# Patient Record
Sex: Female | Born: 1950 | Race: White | Hispanic: No | Smoking: Never smoker
Health system: Southern US, Community
[De-identification: ages and names within clinical notes are randomized; demographics above are authoritative.]

## PROBLEM LIST (undated history)

## (undated) DIAGNOSIS — E785 Hyperlipidemia, unspecified: Secondary | ICD-10-CM

## (undated) DIAGNOSIS — I6529 Occlusion and stenosis of unspecified carotid artery: Secondary | ICD-10-CM

## (undated) HISTORY — PX: TONSILLECTOMY: SUR1361

## (undated) HISTORY — PX: OTHER SURGICAL HISTORY: SHX169

## (undated) HISTORY — PX: CHOLECYSTECTOMY: SHX55

---

## 2000-10-05 ENCOUNTER — Encounter: Payer: Self-pay | Admitting: Gastroenterology

## 2000-10-05 ENCOUNTER — Ambulatory Visit (HOSPITAL_COMMUNITY): Admission: RE | Admit: 2000-10-05 | Discharge: 2000-10-05 | Payer: Self-pay | Admitting: Gastroenterology

## 2004-12-16 ENCOUNTER — Ambulatory Visit: Payer: Self-pay | Admitting: Obstetrics and Gynecology

## 2005-09-14 ENCOUNTER — Ambulatory Visit: Payer: Self-pay

## 2005-09-19 ENCOUNTER — Ambulatory Visit: Payer: Self-pay

## 2006-11-01 ENCOUNTER — Ambulatory Visit: Payer: Self-pay | Admitting: Obstetrics and Gynecology

## 2008-07-22 ENCOUNTER — Ambulatory Visit: Payer: Self-pay | Admitting: Obstetrics and Gynecology

## 2012-11-27 ENCOUNTER — Ambulatory Visit: Payer: Self-pay

## 2014-02-10 ENCOUNTER — Ambulatory Visit: Payer: Self-pay

## 2015-04-21 ENCOUNTER — Other Ambulatory Visit: Payer: Self-pay | Admitting: Internal Medicine

## 2015-04-21 DIAGNOSIS — Z1231 Encounter for screening mammogram for malignant neoplasm of breast: Secondary | ICD-10-CM

## 2015-05-05 ENCOUNTER — Ambulatory Visit: Payer: Self-pay

## 2015-05-19 ENCOUNTER — Ambulatory Visit
Admission: RE | Admit: 2015-05-19 | Discharge: 2015-05-19 | Disposition: A | Discharge status: Home / Self Care | Payer: PPO | Source: Ambulatory Visit | Attending: Internal Medicine | Admitting: Internal Medicine

## 2015-05-19 DIAGNOSIS — Z1231 Encounter for screening mammogram for malignant neoplasm of breast: Secondary | ICD-10-CM | POA: Diagnosis present

## 2016-01-12 DIAGNOSIS — H353131 Nonexudative age-related macular degeneration, bilateral, early dry stage: Secondary | ICD-10-CM | POA: Diagnosis not present

## 2016-04-20 DIAGNOSIS — E785 Hyperlipidemia, unspecified: Secondary | ICD-10-CM | POA: Diagnosis not present

## 2016-04-26 DIAGNOSIS — R079 Chest pain, unspecified: Secondary | ICD-10-CM | POA: Diagnosis not present

## 2016-04-26 DIAGNOSIS — Z0000 Encounter for general adult medical examination without abnormal findings: Secondary | ICD-10-CM | POA: Diagnosis not present

## 2016-05-03 DIAGNOSIS — R0789 Other chest pain: Secondary | ICD-10-CM | POA: Diagnosis not present

## 2016-05-03 DIAGNOSIS — E782 Mixed hyperlipidemia: Secondary | ICD-10-CM | POA: Diagnosis not present

## 2016-05-03 DIAGNOSIS — D485 Neoplasm of uncertain behavior of skin: Secondary | ICD-10-CM | POA: Diagnosis not present

## 2016-05-03 DIAGNOSIS — I208 Other forms of angina pectoris: Secondary | ICD-10-CM | POA: Diagnosis not present

## 2016-05-03 DIAGNOSIS — I6523 Occlusion and stenosis of bilateral carotid arteries: Secondary | ICD-10-CM | POA: Diagnosis not present

## 2016-05-03 DIAGNOSIS — B078 Other viral warts: Secondary | ICD-10-CM | POA: Diagnosis not present

## 2016-05-03 DIAGNOSIS — L820 Inflamed seborrheic keratosis: Secondary | ICD-10-CM | POA: Diagnosis not present

## 2016-05-05 DIAGNOSIS — I208 Other forms of angina pectoris: Secondary | ICD-10-CM | POA: Diagnosis not present

## 2016-05-12 DIAGNOSIS — I208 Other forms of angina pectoris: Secondary | ICD-10-CM | POA: Diagnosis not present

## 2016-05-23 DIAGNOSIS — E782 Mixed hyperlipidemia: Secondary | ICD-10-CM | POA: Diagnosis not present

## 2016-05-23 DIAGNOSIS — R0782 Intercostal pain: Secondary | ICD-10-CM | POA: Diagnosis not present

## 2016-05-23 DIAGNOSIS — I6523 Occlusion and stenosis of bilateral carotid arteries: Secondary | ICD-10-CM | POA: Diagnosis not present

## 2016-05-23 DIAGNOSIS — I208 Other forms of angina pectoris: Secondary | ICD-10-CM | POA: Diagnosis not present

## 2016-08-23 DIAGNOSIS — Z1211 Encounter for screening for malignant neoplasm of colon: Secondary | ICD-10-CM | POA: Diagnosis not present

## 2016-08-30 DIAGNOSIS — Z1212 Encounter for screening for malignant neoplasm of rectum: Secondary | ICD-10-CM | POA: Diagnosis not present

## 2016-08-30 DIAGNOSIS — Z1211 Encounter for screening for malignant neoplasm of colon: Secondary | ICD-10-CM | POA: Diagnosis not present

## 2016-08-31 ENCOUNTER — Other Ambulatory Visit: Payer: Self-pay | Admitting: Internal Medicine

## 2016-08-31 DIAGNOSIS — Z1231 Encounter for screening mammogram for malignant neoplasm of breast: Secondary | ICD-10-CM

## 2016-09-26 ENCOUNTER — Ambulatory Visit
Admission: RE | Admit: 2016-09-26 | Discharge: 2016-09-26 | Disposition: A | Discharge status: Home / Self Care | Payer: PPO | Source: Ambulatory Visit | Attending: Internal Medicine | Admitting: Internal Medicine

## 2016-09-26 DIAGNOSIS — Z1231 Encounter for screening mammogram for malignant neoplasm of breast: Secondary | ICD-10-CM | POA: Diagnosis not present

## 2016-12-23 ENCOUNTER — Encounter: Payer: Self-pay | Admitting: *Deleted

## 2016-12-26 ENCOUNTER — Encounter
Admission: RE | Disposition: A | Discharge status: Home / Self Care | Payer: Self-pay | Source: Ambulatory Visit | Attending: Unknown Physician Specialty

## 2016-12-26 ENCOUNTER — Ambulatory Visit: Payer: PPO | Admitting: Anesthesiology

## 2016-12-26 ENCOUNTER — Ambulatory Visit
Admission: RE | Admit: 2016-12-26 | Discharge: 2016-12-26 | Disposition: A | Discharge status: Home / Self Care | Payer: PPO | Source: Ambulatory Visit | Attending: Unknown Physician Specialty | Admitting: Unknown Physician Specialty

## 2016-12-26 ENCOUNTER — Encounter: Payer: Self-pay | Admitting: *Deleted

## 2016-12-26 DIAGNOSIS — K640 First degree hemorrhoids: Secondary | ICD-10-CM | POA: Insufficient documentation

## 2016-12-26 DIAGNOSIS — Z1211 Encounter for screening for malignant neoplasm of colon: Secondary | ICD-10-CM | POA: Diagnosis not present

## 2016-12-26 DIAGNOSIS — K219 Gastro-esophageal reflux disease without esophagitis: Secondary | ICD-10-CM | POA: Diagnosis not present

## 2016-12-26 DIAGNOSIS — Z79899 Other long term (current) drug therapy: Secondary | ICD-10-CM | POA: Diagnosis not present

## 2016-12-26 DIAGNOSIS — I739 Peripheral vascular disease, unspecified: Secondary | ICD-10-CM | POA: Diagnosis not present

## 2016-12-26 DIAGNOSIS — E785 Hyperlipidemia, unspecified: Secondary | ICD-10-CM | POA: Diagnosis not present

## 2016-12-26 DIAGNOSIS — K5730 Diverticulosis of large intestine without perforation or abscess without bleeding: Secondary | ICD-10-CM | POA: Diagnosis not present

## 2016-12-26 DIAGNOSIS — R195 Other fecal abnormalities: Secondary | ICD-10-CM | POA: Diagnosis not present

## 2016-12-26 DIAGNOSIS — K648 Other hemorrhoids: Secondary | ICD-10-CM | POA: Diagnosis not present

## 2016-12-26 DIAGNOSIS — K5790 Diverticulosis of intestine, part unspecified, without perforation or abscess without bleeding: Secondary | ICD-10-CM | POA: Diagnosis not present

## 2016-12-26 HISTORY — PX: COLONOSCOPY WITH PROPOFOL: SHX5780

## 2016-12-26 HISTORY — DX: Occlusion and stenosis of unspecified carotid artery: I65.29

## 2016-12-26 HISTORY — DX: Hyperlipidemia, unspecified: E78.5

## 2016-12-26 SURGERY — COLONOSCOPY WITH PROPOFOL
Anesthesia: General

## 2016-12-26 MED ORDER — MIDAZOLAM HCL 5 MG/5ML IJ SOLN
INTRAMUSCULAR | Status: DC | PRN
  Administered 2016-12-26 (×2): 1 mg via INTRAVENOUS

## 2016-12-26 MED ORDER — SODIUM CHLORIDE 0.9 % IV SOLN
INTRAVENOUS | Status: DC
  Administered 2016-12-26: 09:00:00 via INTRAVENOUS

## 2016-12-26 MED ORDER — PROPOFOL 10 MG/ML IV BOLUS
INTRAVENOUS | Status: DC | PRN
  Administered 2016-12-26: 40 mg via INTRAVENOUS

## 2016-12-26 MED ORDER — MIDAZOLAM HCL 2 MG/2ML IJ SOLN
INTRAMUSCULAR | Status: AC
  Filled 2016-12-26: qty 2

## 2016-12-26 MED ORDER — LIDOCAINE HCL (PF) 2 % IJ SOLN
INTRAMUSCULAR | Status: DC | PRN
  Administered 2016-12-26: 40 mg

## 2016-12-26 MED ORDER — LIDOCAINE 2% (20 MG/ML) 5 ML SYRINGE
INTRAMUSCULAR | Status: AC
  Filled 2016-12-26: qty 5

## 2016-12-26 MED ORDER — ONDANSETRON HCL 4 MG/2ML IJ SOLN
INTRAMUSCULAR | Status: AC
  Filled 2016-12-26: qty 2

## 2016-12-26 MED ORDER — SODIUM CHLORIDE 0.9 % IV SOLN: INTRAVENOUS | Status: DC

## 2016-12-26 MED ORDER — FENTANYL CITRATE (PF) 100 MCG/2ML IJ SOLN
INTRAMUSCULAR | Status: DC | PRN
  Administered 2016-12-26 (×2): 25 ug via INTRAVENOUS
  Administered 2016-12-26: 50 ug via INTRAVENOUS

## 2016-12-26 MED ORDER — ONDANSETRON HCL 4 MG/2ML IJ SOLN
INTRAMUSCULAR | Status: DC | PRN
  Administered 2016-12-26: 4 mg via INTRAVENOUS

## 2016-12-26 MED ORDER — FENTANYL CITRATE (PF) 100 MCG/2ML IJ SOLN
INTRAMUSCULAR | Status: AC
  Filled 2016-12-26: qty 2

## 2016-12-26 MED ORDER — PROPOFOL 500 MG/50ML IV EMUL
INTRAVENOUS | Status: DC | PRN
  Administered 2016-12-26: 50 ug/kg/min via INTRAVENOUS

## 2016-12-26 NOTE — Anesthesia Preprocedure Evaluation (Signed)
Anesthesia Evaluation  Patient identified by MRN, date of birth, ID band Patient awake    Reviewed: Allergy & Precautions, H&P , NPO status , Patient's Chart, lab work & pertinent test results  History of Anesthesia Complications Negative for: history of anesthetic complications  Airway Mallampati: III  TM Distance: <3 FB Neck ROM: full    Dental  (+) Poor Dentition, Chipped   Pulmonary neg pulmonary ROS, neg shortness of breath,    Pulmonary exam normal breath sounds clear to auscultation       Cardiovascular Exercise Tolerance: Good (-) angina+ Peripheral Vascular Disease  (-) Past MI and (-) DOE Normal cardiovascular exam Rhythm:regular Rate:Normal     Neuro/Psych TIAnegative psych ROS   GI/Hepatic negative GI ROS, Neg liver ROS, neg GERD  ,  Endo/Other  negative endocrine ROS  Renal/GU negative Renal ROS  negative genitourinary   Musculoskeletal   Abdominal   Peds  Hematology negative hematology ROS (+)   Anesthesia Other Findings Past Medical History: No date: Carotid artery stenosis     Comment: BILATERAL No date: Hyperlipidemia  Past Surgical History: No date: CHOLECYSTECTOMY No date: OOPHRECTOMY Bilateral     Comment: PARTIAL REMOVAL OF BOTH OVARIES No date: TONSILLECTOMY  BMI    Body Mass Index:  22.48 kg/m      Reproductive/Obstetrics negative OB ROS                             Anesthesia Physical Anesthesia Plan  ASA: III  Anesthesia Plan: General   Post-op Pain Management:    Induction:   Airway Management Planned:   Additional Equipment:   Intra-op Plan:   Post-operative Plan:   Informed Consent: I have reviewed the patients History and Physical, chart, labs and discussed the procedure including the risks, benefits and alternatives for the proposed anesthesia with the patient or authorized representative who has indicated his/her understanding and  acceptance.   Dental Advisory Given  Plan Discussed with: Anesthesiologist, CRNA and Surgeon  Anesthesia Plan Comments:         Anesthesia Quick Evaluation

## 2016-12-26 NOTE — H&P (Signed)
   Primary Care Physician:  No PCP Per Patient Primary Gastroenterologist:  Dr. Vira Agar  Pre-Procedure History & Physical: HPI:  Amber Parker is a 66 y.o. female is here for an colonoscopy.   Past Medical History:  Diagnosis Date  . Carotid artery stenosis    BILATERAL  . Hyperlipidemia     Past Surgical History:  Procedure Laterality Date  . CHOLECYSTECTOMY    . OOPHRECTOMY Bilateral    PARTIAL REMOVAL OF BOTH OVARIES  . TONSILLECTOMY      Prior to Admission medications   Medication Sig Start Date End Date Taking? Authorizing Provider  ondansetron (ZOFRAN) 4 MG tablet Take 4 mg by mouth every 8 (eight) hours as needed for nausea or vomiting.   Yes Historical Provider, MD    Allergies as of 12/08/2016  . (Not on File)    Family History  Problem Relation Age of Onset  . Breast cancer Paternal Aunt 53    Social History   Social History  . Marital status: Married    Spouse name: N/A  . Number of children: N/A  . Years of education: N/A   Occupational History  . Not on file.   Social History Main Topics  . Smoking status: Never Smoker  . Smokeless tobacco: Never Used  . Alcohol use No  . Drug use: No  . Sexual activity: Not on file   Other Topics Concern  . Not on file   Social History Narrative  . No narrative on file    Review of Systems: See HPI, otherwise negative ROS  Physical Exam: BP 132/69   Pulse 91   Temp (!) 96.3 F (35.7 C) (Tympanic)   Resp 18   Ht 5' 4.5" (1.638 m)   Wt 60.3 kg (133 lb)   SpO2 100%   BMI 22.48 kg/m  General:   Alert,  pleasant and cooperative in NAD Head:  Normocephalic and atraumatic. Neck:  Supple; no masses or thyromegaly. Lungs:  Clear throughout to auscultation.    Heart:  Regular rate and rhythm. Abdomen:  Soft, nontender and nondistended. Normal bowel sounds, without guarding, and without rebound.   Neurologic:  Alert and  oriented x4;  grossly normal neurologically.  Impression/Plan: Sarina Ill  is here for an colonoscopy to be performed for positive cologuard stool test.  Risks, benefits, limitations, and alternatives regarding  colonoscopy have been reviewed with the patient.  Questions have been answered.  All parties agreeable.   Gaylyn Cheers, MD  12/26/2016, 10:20 AM

## 2016-12-26 NOTE — Transfer of Care (Signed)
Immediate Anesthesia Transfer of Care Note  Patient: Amber Parker  Procedure(s) Performed: Procedure(s): COLONOSCOPY WITH PROPOFOL (N/A)  Patient Location: PACU  Anesthesia Type:General  Level of Consciousness: sedated  Airway & Oxygen Therapy: Patient Spontanous Breathing and Patient connected to nasal cannula oxygen  Post-op Assessment: Report given to RN and Post -op Vital signs reviewed and stable  Post vital signs: Reviewed and stable  Last Vitals:  Vitals:   12/26/16 0854  BP: 132/69  Pulse: 91  Resp: 18  Temp: (!) 35.7 C    Last Pain:  Vitals:   12/26/16 0854  TempSrc: Tympanic  PainSc:       Patients Stated Pain Goal: 7 ([id number])  Complications: No apparent anesthesia complications

## 2016-12-26 NOTE — Op Note (Signed)
[facility]enter[facility] Patient Name: Amber Parker Procedure Date: 12/26/2016 10:13 AM MRN: [id number] Account #: [id number] Date of Birth: [birth year] Admit Type: Outpatient Age: 66 Room: [facility] ENDO ROOM 1 Gender: Female Note Status: Finalized Procedure:            Colonoscopy Indications:          Positive Cologuard test Providers:            Manya Silvas, MD Referring MD:         Leona Carry. Hall Busing, MD (Referring MD) Medicines:            Propofol per Anesthesia Complications:        No immediate complications. Procedure:            Pre-Anesthesia Assessment:                       - After reviewing the risks and benefits, the patient                        was deemed in satisfactory condition to undergo the                        procedure.                       After obtaining informed consent, the colonoscope was                        passed under direct vision. Throughout the procedure,                        the patient's blood pressure, pulse, and oxygen                        saturations were monitored continuously. The                        Colonoscope was introduced through the anus and                        advanced to the the cecum, identified by appendiceal                        orifice and ileocecal valve. The colonoscopy was                        performed without difficulty. The patient tolerated the                        procedure well. The quality of the bowel preparation                        was excellent. Findings:      A single small-mouthed diverticulum was found in the sigmoid colon.      Internal hemorrhoids were found during endoscopy. The hemorrhoids were       medium-sized and Grade I (internal hemorrhoids that do not prolapse).      The exam was otherwise without abnormality. Impression:           - Diverticulosis in the sigmoid colon.                       -  Internal hemorrhoids.                       - The examination was  otherwise normal.                       - No specimens collected. Recommendation:       - Repeat colonoscopy in 10 years for screening purposes. Manya Silvas, MD 12/26/2016 10:45:12 AM This report has been signed electronically. Number of Addenda: 0 Note Initiated On: 12/26/2016 10:13 AM Scope Withdrawal Time: 0 hours 7 minutes 10 seconds  Total Procedure Duration: 0 hours 14 minutes 33 seconds       [facility]

## 2016-12-26 NOTE — Anesthesia Postprocedure Evaluation (Addendum)
Anesthesia Post Note  Patient: [place]  Procedure(s) Performed: Procedure(s) (LRB): COLONOSCOPY WITH PROPOFOL (N/A)  Patient location during evaluation: Endoscopy Anesthesia Type: General Level of consciousness: awake and alert Pain management: pain level controlled Vital Signs Assessment: post-procedure vital signs reviewed and stable Respiratory status: spontaneous breathing, nonlabored ventilation, respiratory function stable and patient connected to nasal cannula oxygen Cardiovascular status: blood pressure returned to baseline and stable Postop Assessment: no signs of nausea or vomiting Anesthetic complications: no     Last Vitals:  Vitals:   12/26/16 1107 12/26/16 1117  BP: 113/65 113/64  Pulse: 81 80  Resp: 15 12  Temp:      Last Pain:  Vitals:   12/26/16 1047  TempSrc: Tympanic  PainSc:                  Precious Haws Eero Dini

## 2016-12-27 ENCOUNTER — Encounter: Payer: Self-pay | Admitting: Unknown Physician Specialty

## 2017-02-01 ENCOUNTER — Ambulatory Visit (INDEPENDENT_AMBULATORY_CARE_PROVIDER_SITE_OTHER): Payer: Self-pay | Admitting: Family Medicine

## 2017-02-01 ENCOUNTER — Encounter: Payer: Self-pay | Admitting: Family Medicine

## 2017-02-01 VITALS — BP 121/61 | HR 80 | Temp 98.7°F | Resp 16 | Ht 64.0 in | Wt 133.6 lb

## 2017-02-01 DIAGNOSIS — M25561 Pain in right knee: Secondary | ICD-10-CM | POA: Diagnosis not present

## 2017-02-01 DIAGNOSIS — Z1159 Encounter for screening for other viral diseases: Secondary | ICD-10-CM | POA: Diagnosis not present

## 2017-02-01 DIAGNOSIS — I6523 Occlusion and stenosis of bilateral carotid arteries: Secondary | ICD-10-CM

## 2017-02-01 DIAGNOSIS — M25562 Pain in left knee: Secondary | ICD-10-CM

## 2017-02-01 DIAGNOSIS — Z7689 Persons encountering health services in other specified circumstances: Secondary | ICD-10-CM | POA: Diagnosis not present

## 2017-02-01 DIAGNOSIS — E782 Mixed hyperlipidemia: Secondary | ICD-10-CM

## 2017-02-01 DIAGNOSIS — Z23 Encounter for immunization: Secondary | ICD-10-CM | POA: Diagnosis not present

## 2017-02-01 DIAGNOSIS — J302 Other seasonal allergic rhinitis: Secondary | ICD-10-CM | POA: Diagnosis not present

## 2017-02-01 DIAGNOSIS — E7801 Familial hypercholesterolemia: Secondary | ICD-10-CM | POA: Insufficient documentation

## 2017-02-01 DIAGNOSIS — E785 Hyperlipidemia, unspecified: Secondary | ICD-10-CM | POA: Insufficient documentation

## 2017-02-01 DIAGNOSIS — G8929 Other chronic pain: Secondary | ICD-10-CM | POA: Insufficient documentation

## 2017-02-01 DIAGNOSIS — S60222A Contusion of left hand, initial encounter: Secondary | ICD-10-CM | POA: Diagnosis not present

## 2017-02-01 DIAGNOSIS — J309 Allergic rhinitis, unspecified: Secondary | ICD-10-CM | POA: Insufficient documentation

## 2017-02-01 NOTE — Assessment & Plan Note (Addendum)
Stable mild intimal thickening with carotid artery stenosis on last carotid dopplers in 2017 per cardiology, otherwise ischemic work-up negative, no known CAD - Given risk of age at 32, family history, and known chronic HLD patient is likely above average / elevated ASCVD risk - Followed by [facility] (Dr Nehemiah Massed)  Plan: 1. Discussion on future ASCVD risk, awaiting future lipids to calculated actual 10 yr ASCVD risk score, counseling on likely benefit outweigh risk of ASA 81mg  daily, if cannot tolerate statin therapy, also discussed Repatha as possibility for primary CV prevention, likely low dose 140mg  q 2 weeks sq as an option in future 2. Start ASA 81mg  daily now, see if can tolerate - Follow-up

## 2017-02-01 NOTE — Patient Instructions (Signed)
Thank you for coming in to clinic today.  1. As discussed, recommend baby Aspirin 81mg  daily, if you can tolerate this medicine - Will not start any new statin medications - Consider Repatha for cholesterol lowering in future  2. For sinuses - Can try once daily OTC Loratadine or Cetirizine for anti-histamine - If you want to try Flonase that is fine as well, may increase risk of nose bleed - Continue Nasal saline  3. For knees, most likely some wear and tear arthritis - In future we can consider an X-ray to get baseline  Recommend to start taking Tylenol Extra Strength 500mg  tabs - take 1 to 2 tabs per dose (max 1000mg ) every 6-8 hours for pain (if you get a flare then take regularly, don't skip a dose for next 7 days), max 24 hour daily dose is 6 tablets or 3000mg . In the future you can repeat the same everyday Tylenol course for 1-2 weeks at a time.  - This is safe to take with anti-inflammatory medicines (Ibuprofen, Advil, Naproxen, Aleve)  Otherwise if flare up of knee pain  Use RICE therapy: - R - Rest / relative rest with activity modification avoid overuse of joint - I - Ice packs (make sure you use a towel or sock / something to protect skin) - C - Compression with flexible Knee Sleeve ACE wrap to apply pressure and reduce swelling allowing more support - E - Elevation - if significant swelling, lift leg above heart level (toes above your nose) to help reduce swelling, most helpful at night after day of being on your feet  Don't worry about lymph node, I think that one is resolved  Left hand seems to be a contusion vs hematoma, should continue to resolve as well, if worsening can re-consider x-ray, otherwise likely do not need  You will be due for FASTING BLOOD WORK (no food or drink after midnight before, only water or coffee without cream/sugar on the morning of)  - Please go ahead and schedule a "Lab Only" visit in the morning at the clinic for lab draw in 3 months (end of  May 2018) before next Annual Physical - Make sure Lab Only appointment is at least 1-2 weeks before your next appointment, so that results will be available  For Lab Results, once available within 2-3 days of blood draw, you can can log in to MyChart online to view your results and a brief explanation. Also, we can discuss results at next follow-up visit.  Please schedule a follow-up appointment with Dr. Parks Ranger in 3 months Annual Physical  * You may schedule your Annual Medicare Wellness Exam anytime after 05/2017 * - not with me, should be with [facility] Nurse practitioner here at our office  If you have any other questions or concerns, please feel free to call the clinic or send a message through [place]. You may also schedule an earlier appointment if necessary.  Nobie Putnam, DO [place]

## 2017-02-01 NOTE — Assessment & Plan Note (Signed)
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 Discussion today regarding ASCVD risk, biggest factor likely age at 72, otherwise normal BP, reportedly normal glucose. Recommend resuming ASA 81mg  again, prior intolerance years ago with some easy bruising and nose bleeds. 2. Check future fasting lipids at next physical 2 months, will calculate ASCVD risk 3. Also discussed possibility of starting Repatha (PSK9 inhib) for CV prevention, likely low dose 140mg  q 2 weeks subQ as an option due to statin intolerance, consider this

## 2017-02-01 NOTE — Assessment & Plan Note (Addendum)
Stable chronic mild and only intermittent bilateral genearlized Knee pain without significant swelling without known injury or trauma  - No known knee OA/DJD but suspected likely due to underlying osteoarthritis given age and exam - No joint instability or other red flags - No prior history of knee surgery, arthroscopy - Inadequate conservative therapy  Plan: 1. Continue conservative therapy, start more regular Tylenol PRN dosing given 2. Consider PRN NSAID, caution given age, and prior intolerance 3. Routine RICE therapy PRN flare (rest, ice, compression, elevation) for swelling, activity modification 4. Consider future Knee x-rays to establish baseline 5. Follow-up as needed, consider muscle relaxant, oral vs topical anti-inflammatory, knee injection, PT discussion on overall prognosis of arthritis

## 2017-02-01 NOTE — Progress Notes (Addendum)
Subjective:    Patient ID: Amber Parker, female    DOB: [birth year], 66 y.o.   MRN: [id number]  Amber Parker is a 66 y.o. female presenting on 02/01/2017 for Establish Care (had bruise on Left side arm and lymph node was sore back in 11/2016)  Previously seen by Dr Hall Busing in [place] for 2 years, last general medical visit 04/2016. Here to establish as new patient.  HPI  Left Hand Bruise / Contusion - Reports bumped her left hand on a table yesterday and had some immediate swelling and bruising, this has improved dramatically, used ice packs. Minimal pain, still residual swelling  Mandibular LAD - Resolved - Reports in 11/2016 she had area under her mandible with localized swelling and tenderness, she was concerned about lymph node, denies significant URI or sinus symptoms at that time or other provoking factor, unaware of other lymph nodes - Now much improved, still mild residual soreness but wanted it re-checked  HYPERLIPIDEMIA / Diabetes Screening - Reports chronic history since age 61s, has been on most statin medications with intolerance due to myalgias (prior trials  - Last lipid panel 04/2016 from outside records Dr Hall Busing, awaiting request, reports elevated bad cholesterol, does not recall numbers - She has heard of Repatha and considered this, but needs more information - Never took baby aspirin regularly, but has considered this in the past and will get nose bleeds easily, this was years ago interested in re-attempting this to see if she tolerates it better - Prior DM screenings were normal, unsure last result - Tries to eat healthy diet, limits meats, moderate carbs  Chronic Rhinosinusitis - Doesnt use Flonase, uses nasal saline several times - Considering anti-histamine  Chronic Intermittent Knee Pain - Reports chronic history of intermittent mild knee pain flares, no known significant injury or trauma - Currently asymptomatic, has not had any major knee problems. No prior surgery  or treatment. No recent x-ray or imaging. - Takes Tylenol OTC PRN infrequently - Denies swelling with flares  H/o Angina with Generalized Weakness - Work-up per Dr Nehemiah Massed [facility]) in 04/2016, had Stress Echo, Carotid dopplers (no change compared to 10 years ago) - Prior history of possible TIA >10 years ago - No recurrence of symptoms since - Remains active. 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 No difficulty with exertional symptoms with regular walking, stairs  No rx medications - Takes some OTC Tylenol PRN, no formal dx of osteoarthritis but has recurrent knee pain intermittent flares - Mylanta Pepto PRN indigestion  Health Maintenance: - Due for high dose flu vaccine today, has received previous seasons, but not this year - Due for 1st pneumococcal vaccine. [id number] - Due for Hep C screening, will check with future blood work - UTD Mammogram 09/26/16, normal, yearly re-check - UTD Colonoscopy (last 12/26/16, Dr Vira Agar, [facility] - reviewed results with mild diverticulosis, internal hemorrhoids, no polyps, repeat due in 10 years, 12/2026)  Depression screen PHQ 2/9 02/01/2017  Decreased Interest 0  Down, Depressed, Hopeless 0  PHQ - 2 Score 0   Past Medical History:  Diagnosis Date  . Carotid artery stenosis    BILATERAL  . Hyperlipidemia    Past Surgical History:  Procedure Laterality Date  . CHOLECYSTECTOMY    . COLONOSCOPY WITH PROPOFOL N/A 12/26/2016   Procedure: COLONOSCOPY WITH PROPOFOL;  Surgeon: Manya Silvas, MD;  Location: [facility] ENDOSCOPY;  Service: Endoscopy;  Laterality: N/A;  . OOPHRECTOMY Bilateral    PARTIAL REMOVAL OF BOTH  OVARIES  . TONSILLECTOMY     Social History   Social History  . Marital status: Married    Spouse name: N/A  . Number of children: N/A  . Years of education: N/A   Occupational History  . Not on file.   Social History Main Topics  . Smoking status: Never Smoker  . Smokeless tobacco: Never Used  . Alcohol use No  .  Drug use: No  . Sexual activity: Not on file   Other Topics Concern  . Not on file   Social History Narrative  . No narrative on file   Family History  Problem Relation Age of Onset  . Breast cancer Paternal Aunt 13  . Hypertension Mother   . Hyperlipidemia Mother   . Stroke Mother   . Heart disease Mother   . Cancer Mother     lung cancer  . Diabetes Mother   . COPD Mother   . Heart attack Mother   . Neurologic Disorder Father   . Cancer Father     colon cancer   No current outpatient prescriptions on file prior to visit.   No current facility-administered medications on file prior to visit.     Review of Systems  Constitutional: Negative for activity change, appetite change, chills, diaphoresis, fatigue and fever.  HENT: Positive for congestion (Improving) and sinus pressure. Negative for hearing loss and sinus pain.   Eyes: Negative for visual disturbance.  Respiratory: Negative for apnea, cough, chest tightness, shortness of breath and wheezing.   Cardiovascular: Negative for chest pain, palpitations and leg swelling.  Gastrointestinal: Negative for abdominal pain, constipation, diarrhea, nausea and vomiting.  Endocrine: Negative for cold intolerance and polyuria.  Genitourinary: Negative for dysuria, frequency and hematuria.  Musculoskeletal: Positive for arthralgias (occasional knee pain). Negative for back pain, joint swelling and neck pain.  Skin: Negative for rash.  Allergic/Immunologic: Positive for environmental allergies.  Neurological: Negative for dizziness, weakness, light-headedness, numbness and headaches.  Hematological: Negative for adenopathy.  Psychiatric/Behavioral: Negative for behavioral problems, decreased concentration, dysphoric mood and sleep disturbance. The patient is not nervous/anxious.    Per HPI unless specifically indicated above     Objective:    BP 121/61   Pulse 80   Temp 98.7 F (37.1 C) (Oral)   Resp 16   Ht 5\' 4"  (1.626  m)   Wt 133 lb 9.6 oz (60.6 kg)   BMI 22.93 kg/m   Wt Readings from Last 3 Encounters:  02/01/17 133 lb 9.6 oz (60.6 kg)  12/26/16 133 lb (60.3 kg)    Physical Exam  Constitutional: She is oriented to person, place, and time. She appears well-developed and well-nourished. No distress.  Well-appearing, comfortable, cooperative  HENT:  Head: Normocephalic and atraumatic.  Mouth/Throat: Oropharynx is clear and moist.  Very minimal frontal sinus tenderness. Maxillary sinus non-tender. Nares patent with some mild deeper turbinate edema without congestion or purulence. Bilateral TMs clear without erythema, effusion or bulging. Oropharynx clear without erythema, exudates, edema or asymmetry.  Eyes: Conjunctivae and EOM are normal. Pupils are equal, round, and reactive to light.  Neck: Normal range of motion. Neck supple. No thyromegaly present.  No carotid bruits  No appreciable left sided submandibular lymphadenopathy, non tender. Resolved  Cardiovascular: Normal rate, regular rhythm, normal heart sounds and intact distal pulses.   No murmur heard. Pulmonary/Chest: Effort normal and breath sounds normal. No respiratory distress. She has no wheezes. She has no rales.  Good air movement  Abdominal: Soft. Bowel  sounds are normal. She exhibits no distension and no mass. There is no tenderness.  Musculoskeletal: Normal range of motion. She exhibits no edema or tenderness.  Left hand dorsal 1st Metacarpal region into base of thumb approx 3x3 cm round area of mild fading ecchymosis with small nodule consistent with resolving hematoma - No bony tenderness over hand, metacarpals or thumb - Full ROM, strength normal  Bilateral Knees Inspection: Normal appearance and symmetrical. No ecchymosis or effusion. Palpation: Non-tender. Moderate fine crepitus bilateral R>L ROM: Full active ROM bilaterally Special Testing: Lachman / Valgus/Varus tests negative with intact ligaments (ACL, MCL, LCL) Strength:  5/5 intact knee flex/ext, ankle dorsi/plantarflex Neurovascular: distally intact sensation light touch and pulses  Lymphadenopathy:    She has no cervical adenopathy.  Neurological: She is alert and oriented to person, place, and time.  Distal sensation to light touch intact  Skin: Skin is warm and dry. No rash noted. She is not diaphoretic. No erythema.  Psychiatric: She has a normal mood and affect. Her behavior is normal.  Well groomed, good eye contact, normal speech and thoughts  Nursing note and vitals reviewed.  No results found for this or any previous visit.    Assessment & Plan:   Problem List Items Addressed This Visit    Hyperlipidemia    Chronic problem, awaiting results of last lipid panel 04/2016 outside PCP - Long history of statin intolerance with myalgias (failed atorvastatin, rosuvastatin, simvastatin, and others) - Some family history of HLD, Heart Disease, CVA  Plan: 1. Discussion today regarding ASCVD risk, biggest factor likely age at 59, otherwise normal BP, reportedly normal glucose. Recommend resuming ASA 81mg  again, prior intolerance years ago with some easy bruising and nose bleeds. 2. Check future fasting lipids at next physical 2 months, will calculate ASCVD risk 3. Also discussed possibility of starting Repatha (PSK9 inhib) for CV prevention, likely low dose 140mg  q 2 weeks subQ as an option due to statin intolerance, consider this      Relevant Orders   Hemoglobin A1c   COMPLETE METABOLIC PANEL WITH GFR   Lipid panel   Bilateral chronic knee pain    Stable chronic mild and only intermittent bilateral genearlized Knee pain without significant swelling without known injury or trauma  - No known knee OA/DJD but suspected likely due to underlying osteoarthritis given age and exam - No joint instability or other red flags - No prior history of knee surgery, arthroscopy - Inadequate conservative therapy  Plan: 1. Continue conservative therapy, start  more regular Tylenol PRN dosing given 2. Consider PRN NSAID, caution given age, and prior intolerance 3. Routine RICE therapy PRN flare (rest, ice, compression, elevation) for swelling, activity modification 4. Consider future Knee x-rays to establish baseline 5. Follow-up as needed, consider muscle relaxant, oral vs topical anti-inflammatory, knee injection, PT discussion on overall prognosis of arthritis       Bilateral carotid artery stenosis    Stable mild intimal thickening with carotid artery stenosis on last carotid dopplers in 2017 per cardiology, otherwise ischemic work-up negative, no known CAD - Given risk of age at 70, family history, and known chronic HLD patient is likely above average / elevated ASCVD risk - Followed by [facility] (Dr Nehemiah Massed)  Plan: 1. Discussion on future ASCVD risk, awaiting future lipids to calculated actual 10 yr ASCVD risk score, counseling on likely benefit outweigh risk of ASA 81mg  daily, if cannot tolerate statin therapy, also discussed Repatha as possibility for primary CV prevention, likely low  dose 140mg  q 2 weeks sq as an option in future 2. Start ASA 81mg  daily now, see if can tolerate - Follow-up      Relevant Orders   Hemoglobin A1c   COMPLETE METABOLIC PANEL WITH GFR   Lipid panel   Allergic rhinosinusitis    Likely chronic component contributing to her symptoms. - Start anti-histamine OTC daily - Intolerance to Flonase in past, can hold for now - Continue nasal saline       Other Visit Diagnoses    Encounter to establish care with new doctor    -  Primary   Relevant Orders   COMPLETE METABOLIC PANEL WITH GFR   Lipid panel   Need for influenza vaccination       Relevant Orders   Flu vaccine HIGH DOSE PF (Completed)   Need for pneumococcal vaccine       Prevnar13 today, 1st dose, then next due Pneumovax PPSV23 in 1 year   Relevant Orders   Pneumococcal conjugate vaccine 13-valent IM (Completed)   Contusion of left  hand, initial encounter       Improving, non tender, likely mild hematoma with easy bruising. Unlikely fracture. Defer X-ray, follow-up if not improved   Need for hepatitis C screening test       Relevant Orders   Hepatitis C antibody      No orders of the defined types were placed in this encounter.     Follow up plan: Return in about 3 months (around 05/01/2017) for Annual Physical.   Follow-up 4-6 months for Annual Medicare Wellness Visit  Nobie Putnam, [facility] 02/01/2017, 12:12 PM

## 2017-02-01 NOTE — Assessment & Plan Note (Signed)
Likely chronic component contributing to her symptoms. - Start anti-histamine OTC daily - Intolerance to Flonase in past, can hold for now - Continue nasal saline

## 2017-04-25 ENCOUNTER — Other Ambulatory Visit: Payer: PPO

## 2017-05-03 ENCOUNTER — Encounter: Payer: PPO | Admitting: Family Medicine

## 2017-05-10 ENCOUNTER — Ambulatory Visit
Admission: RE | Admit: 2017-05-10 | Discharge: 2017-05-10 | Disposition: A | Discharge status: Home / Self Care | Payer: PPO | Source: Ambulatory Visit | Attending: Family Medicine | Admitting: Family Medicine

## 2017-05-10 ENCOUNTER — Encounter: Payer: PPO | Admitting: Family Medicine

## 2017-05-10 ENCOUNTER — Ambulatory Visit (INDEPENDENT_AMBULATORY_CARE_PROVIDER_SITE_OTHER): Payer: PPO | Admitting: Family Medicine

## 2017-05-10 ENCOUNTER — Encounter: Payer: Self-pay | Admitting: Family Medicine

## 2017-05-10 VITALS — BP 124/54 | HR 95 | Temp 98.4°F | Resp 16 | Ht 64.0 in | Wt 131.0 lb

## 2017-05-10 DIAGNOSIS — M79641 Pain in right hand: Secondary | ICD-10-CM | POA: Diagnosis not present

## 2017-05-10 DIAGNOSIS — M19041 Primary osteoarthritis, right hand: Secondary | ICD-10-CM | POA: Insufficient documentation

## 2017-05-10 DIAGNOSIS — M79642 Pain in left hand: Secondary | ICD-10-CM | POA: Diagnosis not present

## 2017-05-10 DIAGNOSIS — M7989 Other specified soft tissue disorders: Secondary | ICD-10-CM

## 2017-05-10 DIAGNOSIS — R768 Other specified abnormal immunological findings in serum: Secondary | ICD-10-CM

## 2017-05-10 DIAGNOSIS — M19042 Primary osteoarthritis, left hand: Secondary | ICD-10-CM | POA: Diagnosis not present

## 2017-05-10 LAB — COMPLETE METABOLIC PANEL WITH GFR
ALBUMIN: 4.6 g/dL (ref 3.6–5.1)
ALK PHOS: 82 U/L (ref 33–130)
ALT: 18 U/L (ref 6–29)
AST: 20 U/L (ref 10–35)
BUN: 15 mg/dL (ref 7–25)
CHLORIDE: 101 mmol/L (ref 98–110)
CO2: 25 mmol/L (ref 20–31)
Calcium: 10.6 mg/dL — ABNORMAL HIGH (ref 8.6–10.4)
Creat: 0.73 mg/dL (ref 0.50–0.99)
GFR, EST NON AFRICAN AMERICAN: 85 mL/min (ref >=60)
GFR, Est African American: >89 mL/min (ref >=60)
GLUCOSE: 90 mg/dL (ref 65–99)
POTASSIUM: 4.2 mmol/L (ref 3.5–5.3)
SODIUM: 139 mmol/L (ref 135–146)
Total Bilirubin: 0.3 mg/dL (ref 0.2–1.2)
Total Protein: 7.8 g/dL (ref 6.1–8.1)

## 2017-05-10 MED ORDER — DICLOFENAC SODIUM 1 % TD GEL: 2.0000 g | Freq: Four times a day (QID) | TRANSDERMAL | 2 refills | Status: DC | PRN

## 2017-05-10 NOTE — Progress Notes (Addendum)
Subjective:    Patient ID: Amber Parker, female    DOB: May 18, 1951, 66 y.o.   MRN: [id number]  Amber Parker is a 66 y.o. female presenting on 05/10/2017 for Hand Pain (hand swelling onset 02/18 both side)   HPI   Hand Swelling / Follow-up Left Hand Bruise / Contusion - Last visit 02/01/17 for establish care, had a left hand contusion injury with gradual resolution, and then 2-3 weeks later developed new problem with Right hand and finger swelling seemed to be more acute onset rather than gradual, several days later both hands involved to include left hand, mostly in palmar below knuckles and fingers, noticed that she couldn't get her wedding band off due to swelling. Described swelling was more intermittent in March 2018, and then developed some aching and pain in each finger joint bilateral, worse with aching with movements and grip, feeling sensation of "tightness in finger". Gradual worsening over past 2 months. 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 - Tried Aleve OTC 1 pill 273m BID for few weeks (felt "odd in chest") and stopped this, tried tbs Olive Oil daily for few weeks, now taking Tylenol 5052mx 2 per dose 100010mID and Ibuprofen x 1 200m9mily - Tried OTC Casteva castor oil for knees - History of possible arthritis in knee pain - Admits intermittent tingling in fingers, different areas - Denies pain, redness, fevers/chills, numbness, weakness, no new significant injury or trauma  Social History  Substance Use Topics  . Smoking status: Never Smoker  . Smokeless tobacco: Never Used  . Alcohol use No    Review of Systems Per HPI unless specifically indicated above     Objective:    BP (!) 124/54   Pulse 95   Temp 98.4 F (36.9 C) (Oral)   Resp 16   Ht 5' 4" (1.626 m)   Wt 131 lb (59.4 kg)   BMI 22.49 kg/m   Wt Readings from Last 3 Encounters:  05/10/17 131 lb (59.4 kg)  02/01/17 133 lb 9.6 oz (60.6 kg)  12/26/16 133 lb (60.3 kg)    Physical Exam    Constitutional: She is oriented to person, place, and time. She appears well-developed and well-nourished. No distress.  Well-appearing, comfortable, cooperative  Eyes: Conjunctivae are normal.  Neck: Normal range of motion. Neck supple. No thyromegaly present.  Cardiovascular: Normal rate, regular rhythm, normal heart sounds and intact distal pulses.   No murmur heard. Pulmonary/Chest: Effort normal and breath sounds normal. No respiratory distress. She has no wheezes. She has no rales.  Musculoskeletal:  Bilateral Hand/Wrist Inspection: mild fullness with Amber edema to most fingers bilaterally, symmetrical, no bulky MCP joints, slight warmth without obvious erythema. Palpation: Non tender hand / wrist, carpal bones, including MCP, base of thumb. No distinct anatomical snuff box or scaphoid tenderness ROM: full active wrist ROM flex / ext, ulnar / radial deviation, mild pain with thumb opposition Special Testing: Negative Tinel's median nerve or ulnar nerve elbow Strength: 5/5 grip, thumb opposition, wrist flex/ext Neurovascular: distally intact  Lymphadenopathy:    She has no cervical adenopathy.  Neurological: She is alert and oriented to person, place, and time.  Skin: Skin is warm and dry. She is not diaphoretic.  Psychiatric: She has a normal mood and affect. Her behavior is normal.  Nursing note and vitals reviewed.  I have personally reviewed the radiology report from 05/10/17 on Left / Right Hand X-ray.  CLINICAL DATA:  Hand pain and swelling.  EXAM: LEFT HAND - COMPLETE 3+ VIEW  COMPARISON:  None.  FINDINGS: There are minimal osteoarthritic changes of the DIP joint of the index finger and at the PIP joint of the long finger. The metacarpal phalangeal joints and wrist joints appear normal.  No periarticular demineralization or subluxation.  IMPRESSION: Focal osteoarthritic changes as described.   Electronically Signed   By: Lorriane Shire M.D.   On:  05/10/2017 11:18  CLINICAL DATA:  Hand pain and swelling for 3 months.  EXAM: RIGHT HAND - COMPLETE 3+ VIEW  COMPARISON:  None  FINDINGS: There is slight osteoarthritic changes of the DIP joints of the index and long fingers. Metacarpal phalangeal joints and wrist joints appear normal. No periarticular demineralization or subluxation.  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   Electronically Signed   By: Lorriane Shire M.D.   On: 05/10/2017 11:19  Results for orders placed or performed in visit on 05/10/17  C-reactive protein  Result Value Ref Range   CRP 1.2 <9.5 mg/L  Cyclic citrul peptide antibody, IgG  Result Value Ref Range   Cyclic Citrullin Peptide Ab >250 (H) Units  Rheumatoid Factor  Result Value Ref Range   Rhuematoid fact SerPl-aCnc 35 (H) <14 IU/mL  Sed Rate (ESR)  Result Value Ref Range   Sed Rate 22 0 - 30 mm/hr  COMPLETE METABOLIC PANEL WITH GFR  Result Value Ref Range   Sodium 139 135 - 146 mmol/L   Potassium 4.2 3.5 - 5.3 mmol/L   Chloride 101 98 - 110 mmol/L   CO2 25 20 - 31 mmol/L   Glucose, Bld 90 65 - 99 mg/dL   BUN 15 7 - 25 mg/dL   Creat 0.73 0.50 - 0.99 mg/dL   Total Bilirubin 0.3 0.2 - 1.2 mg/dL   Alkaline Phosphatase 82 33 - 130 U/L   AST 20 10 - 35 U/L   ALT 18 6 - 29 U/L   Total Protein 7.8 6.1 - 8.1 g/dL   Albumin 4.6 3.6 - 5.1 g/dL   Calcium 10.6 (H) 8.6 - 10.4 mg/dL   GFR, Est African American >89 >=60 mL/min   GFR, Est Non African American 85 >=60 mL/min      Assessment & Plan:   Problem List Items Addressed This Visit    Bilateral hand swelling - Primary   Relevant Orders   C-reactive protein (Completed)   Cyclic citrul peptide antibody, IgG (Completed)   Rheumatoid Factor (Completed)   Sed Rate (ESR) (Completed)   DG Hand Complete Left (Completed)   DG Hand Complete Right (Completed)   Bilateral hand pain   Relevant Medications   diclofenac sodium (VOLTAREN)  1 % GEL   Other Relevant Orders   C-reactive protein (Completed)   Cyclic citrul peptide antibody, IgG (Completed)   Rheumatoid Factor (Completed)   Sed Rate (ESR) (Completed)   DG Hand Complete Left (Completed)   DG Hand Complete Right (Completed)  #Bilateral Hand Pain & Swelling Unclear exact etiology, concern with symmetrical symptoms gradual worsening may be underlying arthropathy recently triggered or flared, possible OA/DJD vs RA or other inflammatory condition. Confused about recent minor injury how it relates to onset of symptoms. Not consistent with gout clinically. No obvious nerve injury or carpal tunnel but possible with some swelling symptoms.  Plan: 1. Broad work-up discussed today start with X-ray imaging and Labs 2. Reviewed results of X-rays bilateral hands with only mild areas of OA/DJD DIP,  PIP several fingers, but no significant joint destruction or MCP involvement, patient notified 3. Check ESR, CRP for inflammatory screening, also given specific concern of RA, will check RF and Anti-CCP - tests still pending, will follow-up results 4. Limited results on oral NSAIDs - start new topical NSAID diclofenac gel for hands up to TID 2 weeks PRN, can continue other conservative therapy 5. 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 Agree with next step referral to Rheumatology, she requests to see Dr [facility] at [facility] in [place]. Referral placed.  Meds ordered this encounter  Medications  . diclofenac sodium (VOLTAREN) 1 % GEL    Sig: Apply 2 g topically 4 (four) times daily as needed. For use on hands for arthritis, up to 1-2 weeks per flare    Dispense:  100 g    Refill:  2    Follow up plan: Return in about 4 weeks (around 06/07/2017) for hand pain and swelling.  Nobie Putnam, [facility] 05/11/2017, 2:25 PM

## 2017-05-10 NOTE — Patient Instructions (Signed)
Thank you for coming to the clinic today.  1. X-rays, labs today - will follow-up with results.  2. Stop taking Ibuprofen - Recommend to start taking Tylenol Extra Strength 500mg  tabs - take 1 to 2 tabs per dose (max 1000mg ) every 6-8 hours for pain (take regularly, don't skip a dose for next 7 days), max 24 hour daily dose is 6 tablets or 3000mg . In the future you can repeat the same everyday Tylenol course for 1-2 weeks at a time.   Start Diclofenac topical up to 3-4 times daily for 1-2 weeks on affected hand joints that are bothering you  If not working, then you can increase Aleve OTC go to 2 tablets TWICE daily with food (200-250mg  per tablet, dose is 500mg ) for 1-2 weeks  Then last resort if need to follow-up sooner or call, can try Prednisone.  Use RICE therapy: - R - Rest / relative rest with activity modification avoid overuse of joint - I - Ice packs (make sure you use a towel or sock / something to protect skin) - C - Compression with ACE wrap to apply pressure and reduce swelling allowing more support - E - Elevation - if significant swelling, lift leg above heart level (toes above your nose) to help reduce swelling, most helpful at night after day of being on your feet  You will be due for FASTING BLOOD WORK (no food or drink after midnight before, only water or coffee without cream/sugar on the morning of)  - Please go ahead and schedule a "Lab Only" visit in the morning at the clinic for lab draw in anytime in next several weeks to months, before your annual physical  Please schedule a follow-up appointment with Dr. Parks Ranger in within 3-4 weeks for hand pain and swelling  If you have any other questions or concerns, please feel free to call the clinic or send a message through [facility]. You may also schedule an earlier appointment if necessary.  Nobie Putnam, DO [place]

## 2017-05-11 LAB — C-REACTIVE PROTEIN: CRP: 1.2 mg/L (ref <8.0)

## 2017-05-11 LAB — CYCLIC CITRUL PEPTIDE ANTIBODY, IGG: Cyclic Citrullin Peptide Ab: >250 Units — ABNORMAL HIGH

## 2017-05-11 LAB — SEDIMENTATION RATE: SED RATE: 22 mm/h (ref 0–30)

## 2017-05-11 LAB — RHEUMATOID FACTOR: RHEUMATOID FACTOR: 35 [IU]/mL — AB (ref <14)

## 2017-05-11 NOTE — Addendum Note (Signed)
Addended by: Olin Hauser on: 05/11/2017 02:27 PM   Modules accepted: Orders

## 2017-05-23 DIAGNOSIS — Z79899 Other long term (current) drug therapy: Secondary | ICD-10-CM | POA: Diagnosis not present

## 2017-05-23 DIAGNOSIS — M0579 Rheumatoid arthritis with rheumatoid factor of multiple sites without organ or systems involvement: Secondary | ICD-10-CM | POA: Diagnosis not present

## 2017-06-06 ENCOUNTER — Ambulatory Visit: Payer: PPO | Attending: Rheumatology | Admitting: Occupational Therapy

## 2017-06-06 DIAGNOSIS — M25542 Pain in joints of left hand: Secondary | ICD-10-CM | POA: Insufficient documentation

## 2017-06-06 DIAGNOSIS — M25641 Stiffness of right hand, not elsewhere classified: Secondary | ICD-10-CM | POA: Diagnosis not present

## 2017-06-06 DIAGNOSIS — M25642 Stiffness of left hand, not elsewhere classified: Secondary | ICD-10-CM | POA: Diagnosis not present

## 2017-06-06 DIAGNOSIS — M6281 Muscle weakness (generalized): Secondary | ICD-10-CM | POA: Diagnosis not present

## 2017-06-06 DIAGNOSIS — M25541 Pain in joints of right hand: Secondary | ICD-10-CM | POA: Insufficient documentation

## 2017-06-06 NOTE — Patient Instructions (Signed)
Contrast to be  Done at home  Tendon glides - without increase pain  Opposition to all digits  AROM - no force full   Joint protection principles and AE education done  And hand out reviewed

## 2017-06-06 NOTE — Therapy (Signed)
[facility] [address] [place], [address] Phone: [phone]   Fax:  [phone]  Occupational Therapy Evaluation  Patient Details  Name: Amber Parker MRN: [id number] Date of Birth: [birth year] No Data Recorded  Encounter Date: 06/06/2017      OT End of Session - 06/06/17 1720    Visit Number 1   Number of Visits 4   Date for OT Re-Evaluation 07/04/17   OT Start Time 1315   OT Stop Time 1413   OT Time Calculation (min) 58 min   Activity Tolerance Patient tolerated treatment well;Patient limited by pain   Behavior During Therapy [facility] for tasks assessed/performed      Past Medical History:  Diagnosis Date  . Carotid artery stenosis    BILATERAL  . Hyperlipidemia     Past Surgical History:  Procedure Laterality Date  . CHOLECYSTECTOMY    . COLONOSCOPY WITH PROPOFOL N/A 12/26/2016   Procedure: COLONOSCOPY WITH PROPOFOL;  Surgeon: Manya Silvas, MD;  Location: [facility] ENDOSCOPY;  Service: Endoscopy;  Laterality: N/A;  . OOPHRECTOMY Bilateral    PARTIAL REMOVAL OF BOTH OVARIES  . TONSILLECTOMY      There were no vitals filed for this visit.      Subjective Assessment - 06/06/17 1716    Subjective  My hand pain and stiffness started about Febr - mostly my R and L index fingers - tips of fingers , and in my palm at base of index - pain with gripping - cannot lift , pull , open , cutting or peeling things    Patient Stated Goals I want to get the pain better and be able to use my hands    Currently in Pain? Yes   Pain Score 4    Pain Location Hand   Pain Orientation Right;Left   Pain Descriptors / Indicators Aching   Pain Type Chronic pain   Pain Onset More than a month ago   Pain Frequency Constant           [facility] OT Assessment - 06/06/17 0001      Strength   Right Hand Grip (lbs) 23   Right Hand Lateral Pinch 6 lbs   Right Hand 3 Point Pinch 3 lbs   Left Hand Grip (lbs) 18   Left  Hand Lateral Pinch 11 lbs   Left Hand 3 Point Pinch 3 lbs     Right Hand AROM   R Thumb Opposition to Index --  WNL   R Index  MCP 0-90 85 Degrees   R Index PIP 0-100 95 Degrees   R Long  MCP 0-90 85 Degrees   R Long PIP 0-100 100 Degrees   R Ring  MCP 0-90 90 Degrees   R Ring PIP 0-100 100 Degrees   R Little  MCP 0-90 90 Degrees   R Little PIP 0-100 100 Degrees     Left Hand AROM   L Thumb Opposition to Index --  WNL   L Index  MCP 0-90 90 Degrees   L Index PIP 0-100 100 Degrees   L Long  MCP 0-90 90 Degrees   L Long PIP 0-100 100 Degrees   L Ring  MCP 0-90 90 Degrees   L Ring PIP 0-100 100 Degrees   L Little  MCP 0-90 90 Degrees   L Little PIP 0-100 100 Degrees       review with pt contrast - done in  clinic and then pt to do    Contrast to be  Done at home  Tendon glides - without increase pain  Opposition to all digits  AROM - no force full   Joint protection principles and AE education done  And hand out reviewed   Fitted with isotoner glove for R hand  - for night time  Silicon digi sleeves for bilateral 2nd digits to decrease pain and pressure on DIP during day                   OT Education - 06/06/17 1720    Education provided Yes   Education Details findings and HEP / joint protection and AE    Person(s) Educated Patient   Methods Explanation;Demonstration;Tactile cues;Verbal cues;Handout   Comprehension Verbal cues required;Returned demonstration;Verbalized understanding             OT Long Term Goals - 06/06/17 1723      OT LONG TERM GOAL #1   Title Pt pain at the most decrease to less than 3/10 to be able to use hands in more than 75% of ADL's without increase symptoms    Baseline pain at rest 5/10 and at most 8/10    Time 3   Period Weeks   Status New     OT LONG TERM GOAL #2   Title Pt to verbalize 3 joint protection and AE to increase use of hands and decrease pain in hands    Baseline no knowledge on joint protection or  AE    Time 2   Period Weeks   Status New     OT LONG TERM GOAL #3   Title Grip strength and prehension in bilateral hands increase to [facility] and age range for pt  to report decrease symptoms and increase use    Baseline see flowsheet - pt under age range for all with L the worse    Time 4   Period Weeks   Status New               Plan - 06/06/17 1721    Clinical Impression Statement Pt present with diagnosis of RA - bilateral hand pain - mostly in over volar plates of 2nd and 3rd digits in bilateral hands - edema more in R hand and stiffness -but pain and strength more limited in L hand - pt is L hand dominant- pain ,ROM , and strength limiting her functiional use of hand in ADl's and IADL's    Occupational performance deficits (Please refer to evaluation for details): ADL's;IADL's;Play;Leisure   Rehab Potential Fair   OT Frequency 1x / week   OT Duration 4 weeks   OT Treatment/Interventions Self-care/ADL training;Ultrasound;Patient/family education;Therapeutic exercises;Contrast Bath;DME and/or AE instruction;Manual Therapy   Plan assess progress with homeprogram   Clinical Decision Making Several treatment options, min-mod task modification necessary   OT Home Exercise Plan see pt instruction   Consulted and Agree with Plan of Care Patient      Patient will benefit from skilled therapeutic intervention in order to improve the following deficits and impairments:  Decreased range of motion, Impaired flexibility, Increased edema, Pain, Impaired UE functional use, Decreased strength, Decreased knowledge of use of DME  Visit Diagnosis: Pain in joint of left hand - Plan: Ot plan of care cert/re-cert  Pain in joint of right hand - Plan: Ot plan of care cert/re-cert  Stiffness of left hand, not elsewhere classified - Plan: Ot plan of care cert/re-cert  Stiffness  of right hand, not elsewhere classified - Plan: Ot plan of care cert/re-cert  Muscle weakness (generalized) - Plan: Ot  plan of care cert/re-cert      G-Codes - 41/03/01 1727    Functional Assessment Tool Used (Outpatient only) Pain , ROM , strength , functional use - clinical judgement   Functional Limitation Self care   Self Care Current Status (T1438) At least 40 percent but less than 60 percent impaired, limited or restricted   Self Care Goal Status (O8757) At least 1 percent but less than 20 percent impaired, limited or restricted      Problem List Patient Active Problem List   Diagnosis Date Noted  . Bilateral hand swelling 05/10/2017  . Bilateral hand pain 05/10/2017  . Hyperlipidemia 02/01/2017  . Bilateral chronic knee pain 02/01/2017  . Allergic rhinosinusitis 02/01/2017  . Bilateral carotid artery stenosis 05/03/2016    Rosalyn Gess OTR/L,CLT 06/06/2017, 5:29 PM  [facility] [address] [place], [address] Phone: [phone]   Fax:  [phone]  Name: Amber Parker MRN: [id number] Date of Birth: 11/27/1951

## 2017-06-13 ENCOUNTER — Ambulatory Visit: Payer: PPO | Attending: Rheumatology | Admitting: Occupational Therapy

## 2017-06-13 DIAGNOSIS — M25642 Stiffness of left hand, not elsewhere classified: Secondary | ICD-10-CM | POA: Diagnosis not present

## 2017-06-13 DIAGNOSIS — M25541 Pain in joints of right hand: Secondary | ICD-10-CM | POA: Diagnosis not present

## 2017-06-13 DIAGNOSIS — M25542 Pain in joints of left hand: Secondary | ICD-10-CM

## 2017-06-13 DIAGNOSIS — M25641 Stiffness of right hand, not elsewhere classified: Secondary | ICD-10-CM | POA: Diagnosis not present

## 2017-06-13 DIAGNOSIS — M6281 Muscle weakness (generalized): Secondary | ICD-10-CM | POA: Diagnosis not present

## 2017-06-13 NOTE — Therapy (Signed)
[facility] [address], [place], [address] Phone: [phone]   Fax:  [phone]  Occupational Therapy Treatment  Patient Details  Name: Amber Parker MRN: [id number] Date of Birth: [birth year] No Data Recorded  Encounter Date: 06/13/2017      OT End of Session - 06/13/17 1302    Visit Number 2   Number of Visits 4   Date for OT Re-Evaluation 07/04/17   OT Start Time 1010   OT Stop Time 1100   OT Time Calculation (min) 50 min   Activity Tolerance Patient tolerated treatment well   Behavior During Therapy [facility] for tasks assessed/performed      Past Medical History:  Diagnosis Date  . Carotid artery stenosis    BILATERAL  . Hyperlipidemia     Past Surgical History:  Procedure Laterality Date  . CHOLECYSTECTOMY    . COLONOSCOPY WITH PROPOFOL N/A 12/26/2016   Procedure: COLONOSCOPY WITH PROPOFOL;  Surgeon: Manya Silvas, MD;  Location: [facility] ENDOSCOPY;  Service: Endoscopy;  Laterality: N/A;  . OOPHRECTOMY Bilateral    PARTIAL REMOVAL OF BOTH OVARIES  . TONSILLECTOMY      There were no vitals filed for this visit.      Subjective Assessment - 06/13/17 1107    Subjective  DId okay - tried to modify act - I use the sleeve for finger little but it hurts to put it on - when do I need to wear them - after I had my grandkids my fingers really hurts    Patient Stated Goals I want to get the pain better and be able to use my hands    Currently in Pain? Yes   Pain Score 4    Pain Location Hand   Pain Orientation Right;Left   Pain Descriptors / Indicators Aching   Pain Type Chronic pain        Assess edema - R 2nd digits PIP increase by about 1 cm and distally .3cm Tenderness more in 2nd PIP than DIP  And over A1pulley at 2nd digit L   Grip and prehension assess - see flowsheet  AROM in 2nd digits - L WNL , R 23 MC and PIP 90  Review with pt joint protection again   Silicon digisleeve wearing  review ed on applying it pain free at home  Pt had increase last grip - using silicon sleeve- increase from 7 to11 lbs -  With use - to decrease edema and pain   Review Tendon glides - AROM but do not force and pain free  Do composite fist to 2 cm foam block pain free  , then 1 cm  And then palm - on L  And 2 cm and 1 cm on R hand  Pt to cont with HEP for ROM , compression, change contrast to 2 min heat and 2 min ice  And joint protection                 OT Treatments/Exercises (OP) - 06/13/17 0001      Ultrasound   Ultrasound Location L A1pulley    Ultrasound Parameters 3.3MHZ. Marland Kitchen8 intensity , 20% for 4 min    Ultrasound Goals Pain                OT Education - 06/13/17 1302    Education provided Yes   Education Details Change contrast and composite fist to foam block ., jont protecion review again  Person(s) Educated Patient   Methods Explanation;Demonstration;Tactile cues;Verbal cues;Handout   Comprehension Verbal cues required;Returned demonstration;Verbalized understanding             OT Long Term Goals - 06/06/17 1723      OT LONG TERM GOAL #1   Title Pt pain at the most decrease to less than 3/10 to be able to use hands in more than 75% of ADL's without increase symptoms    Baseline pain at rest 5/10 and at most 8/10    Time 3   Period Weeks   Status New     OT LONG TERM GOAL #2   Title Pt to verbalize 3 joint protection and AE to increase use of hands and decrease pain in hands    Baseline no knowledge on joint protection or AE    Time 2   Period Weeks   Status New     OT LONG TERM GOAL #3   Title Grip strength and prehension in bilateral hands increase to [facility] and age range for pt  to report decrease symptoms and increase use    Baseline see flowsheet - pt under age range for all with L the worse    Time 4   Period Weeks   Status New               Plan - 06/13/17 1303    Clinical Impression Statement Pt cont to have pain  and edema in mostly 2nd digit R and L - pt do show increase grip on L hand , and bilateral 3 point grip - still pain and stiffness in 2nd digit  - pt to cont with contrast, compression , ROM pain free and joint protection for 2 wks    Occupational performance deficits (Please refer to evaluation for details): ADL's;IADL's;Play;Leisure   Rehab Potential Fair   OT Frequency 1x / week   OT Duration 4 weeks   OT Treatment/Interventions Self-care/ADL training;Ultrasound;Patient/family education;Therapeutic exercises;Contrast Bath;DME and/or AE instruction;Manual Therapy   Clinical Decision Making Several treatment options, min-mod task modification necessary   OT Home Exercise Plan see pt instruction   Consulted and Agree with Plan of Care Patient      Patient will benefit from skilled therapeutic intervention in order to improve the following deficits and impairments:  Decreased range of motion, Impaired flexibility, Increased edema, Pain, Impaired UE functional use, Decreased strength, Decreased knowledge of use of DME  Visit Diagnosis: Pain in joint of left hand  Pain in joint of right hand  Stiffness of left hand, not elsewhere classified  Stiffness of right hand, not elsewhere classified  Muscle weakness (generalized)    Problem List Patient Active Problem List   Diagnosis Date Noted  . Bilateral hand swelling 05/10/2017  . Bilateral hand pain 05/10/2017  . Hyperlipidemia 02/01/2017  . Bilateral chronic knee pain 02/01/2017  . Allergic rhinosinusitis 02/01/2017  . Bilateral carotid artery stenosis 05/03/2016    Rosalyn Gess OTR/L,CLT 06/13/2017, 1:05 PM  [facility] [address], [place], [address] Phone: [phone]   Fax:  [phone]  Name: JALYRIC KAESTNER MRN: [id number] Date of Birth: [birth year]

## 2017-06-13 NOTE — Patient Instructions (Addendum)
Pt to cont with HEP for ROM , compression, change contrast to 2 min heat and 2 min ice  And joint protection

## 2017-06-21 DIAGNOSIS — M0579 Rheumatoid arthritis with rheumatoid factor of multiple sites without organ or systems involvement: Secondary | ICD-10-CM | POA: Diagnosis not present

## 2017-06-21 DIAGNOSIS — Z79899 Other long term (current) drug therapy: Secondary | ICD-10-CM | POA: Diagnosis not present

## 2017-06-27 ENCOUNTER — Ambulatory Visit: Payer: PPO | Admitting: Occupational Therapy

## 2017-06-27 DIAGNOSIS — M6281 Muscle weakness (generalized): Secondary | ICD-10-CM

## 2017-06-27 DIAGNOSIS — M25641 Stiffness of right hand, not elsewhere classified: Secondary | ICD-10-CM

## 2017-06-27 DIAGNOSIS — M25541 Pain in joints of right hand: Secondary | ICD-10-CM

## 2017-06-27 DIAGNOSIS — M25542 Pain in joints of left hand: Secondary | ICD-10-CM | POA: Diagnosis not present

## 2017-06-27 DIAGNOSIS — M25642 Stiffness of left hand, not elsewhere classified: Secondary | ICD-10-CM

## 2017-06-27 NOTE — Patient Instructions (Signed)
Pt to cont with tendon glide  Opposition to all digits  Do contrast as needed  And use joint protection and AE trng

## 2017-06-27 NOTE — Therapy (Signed)
[facility] [address], [place], [address] Phone: [phone]   Fax:  [phone]  Occupational Therapy Treatment/discharge   Patient Details  Name: Amber Parker MRN: [id number] Date of Birth: 27-Feb-1951 No Data Recorded  Encounter Date: 06/27/2017      OT End of Session - 06/27/17 2025    Visit Number 3   Number of Visits 3   Date for OT Re-Evaluation 06/27/17   OT Start Time 1017   OT Stop Time 1040   OT Time Calculation (min) 23 min   Activity Tolerance Patient tolerated treatment well   Behavior During Therapy [facility] for tasks assessed/performed      Past Medical History:  Diagnosis Date  . Carotid artery stenosis    BILATERAL  . Hyperlipidemia     Past Surgical History:  Procedure Laterality Date  . CHOLECYSTECTOMY    . COLONOSCOPY WITH PROPOFOL N/A 12/26/2016   Procedure: COLONOSCOPY WITH PROPOFOL;  Surgeon: Manya Silvas, MD;  Location[facility] ENDOSCOPY;  Service: Endoscopy;  Laterality: N/A;  . OOPHRECTOMY Bilateral    PARTIAL REMOVAL OF BOTH OVARIES  . TONSILLECTOMY      There were no vitals filed for this visit.      Subjective Assessment - 06/27/17 2014    Subjective  I am doing much better - pain is better - I am trying to adjust the way I use my hands- this helped me so much    Patient Stated Goals I want to get the pain better and be able to use my hands    Currently in Pain? No/denies            [facility] OT Assessment - 06/27/17 0001      Strength   Right Hand Grip (lbs) 30   Right Hand Lateral Pinch 12 lbs   Right Hand 3 Point Pinch 7 lbs   Left Hand Grip (lbs) 40   Left Hand Lateral Pinch 14 lbs   Left Hand 3 Point Pinch 8.5 lbs     Right Hand AROM   R Index  MCP 0-90 90 Degrees   R Index PIP 0-100 100 Degrees   R Long  MCP 0-90 90 Degrees   R Long PIP 0-100 100 Degrees   R Ring  MCP 0-90 90 Degrees   R Ring PIP 0-100 100 Degrees   R Little  MCP 0-90 90  Degrees   R Little PIP 0-100 100 Degrees     Left Hand AROM   L Index  MCP 0-90 90 Degrees   L Index PIP 0-100 100 Degrees   L Long  MCP 0-90 90 Degrees   L Long PIP 0-100 100 Degrees   L Ring  MCP 0-90 90 Degrees   L Ring PIP 0-100 100 Degrees   L Little  MCP 0-90 90 Degrees   L Little PIP 0-100 100 Degrees      Measurements taken for ROM and strength see flowsheet  Review HEP with pt for ROM  And progress  Joint protection and AE review with pt again  And fitted with 3 xtra silicon digi sleeve to use for R 2nd digit to decrease edema and pain      Pt to cont with tendon glide  Opposition to all digits  Do contrast as needed  And use joint protection and AE trng  OT Education - 06/27/17 2025    Education provided Yes   Education Details discharge with HEP    Person(s) Educated Patient   Methods Explanation;Demonstration;Tactile cues;Verbal cues   Comprehension Verbal cues required;Returned demonstration;Verbalized understanding             OT Long Term Goals - 06/27/17 2030      OT LONG TERM GOAL #1   Title Pt pain at the most decrease to less than 3/10 to be able to use hands in more than 75% of ADL's without increase symptoms    Baseline pain at the most 6/10 with heavy act - at rest 2-3/10    Status Partially Met     OT LONG TERM GOAL #2   Title Pt to verbalize 3 joint protection and AE to increase use of hands and decrease pain in hands    Status Achieved     OT LONG TERM GOAL #3   Title Grip strength and prehension in bilateral hands increase to [facility] and age range for pt  to report decrease symptoms and increase use    Baseline see flowsheet -   Status Achieved               Plan - 06/27/17 2027    Clinical Impression Statement Pt made great progress in ROM and grip /prehension strength in bilateral hands - pt still have pain but improved greatly - pt met all goals and discharge with HEP    Occupational performance  deficits (Please refer to evaluation for details): ADL's;IADL's;Play;Leisure   Clinical Decision Making Limited treatment options, no task modification necessary   OT Home Exercise Plan see pt instruction   Consulted and Agree with Plan of Care Patient      Patient will benefit from skilled therapeutic intervention in order to improve the following deficits and impairments:     Visit Diagnosis: Pain in joint of left hand  Pain in joint of right hand  Stiffness of left hand, not elsewhere classified  Stiffness of right hand, not elsewhere classified  Muscle weakness (generalized)    Problem List Patient Active Problem List   Diagnosis Date Noted  . Bilateral hand swelling 05/10/2017  . Bilateral hand pain 05/10/2017  . Hyperlipidemia 02/01/2017  . Bilateral chronic knee pain 02/01/2017  . Allergic rhinosinusitis 02/01/2017  . Bilateral carotid artery stenosis 05/03/2016    Rosalyn Gess OTR/L,CLT 06/27/2017, 8:32 PM  [facility] [address], [place], [address] Phone: [phone]   Fax:  [phone]  Name: Amber Parker MRN: [id number] Date of Birth: 1951/06/27

## 2017-06-28 DIAGNOSIS — Z79899 Other long term (current) drug therapy: Secondary | ICD-10-CM | POA: Diagnosis not present

## 2017-06-28 DIAGNOSIS — M0579 Rheumatoid arthritis with rheumatoid factor of multiple sites without organ or systems involvement: Secondary | ICD-10-CM | POA: Diagnosis not present

## 2017-07-11 DIAGNOSIS — H353131 Nonexudative age-related macular degeneration, bilateral, early dry stage: Secondary | ICD-10-CM | POA: Diagnosis not present

## 2017-08-28 DIAGNOSIS — Z79899 Other long term (current) drug therapy: Secondary | ICD-10-CM | POA: Diagnosis not present

## 2017-08-28 DIAGNOSIS — M0579 Rheumatoid arthritis with rheumatoid factor of multiple sites without organ or systems involvement: Secondary | ICD-10-CM | POA: Diagnosis not present

## 2017-09-05 DIAGNOSIS — M0579 Rheumatoid arthritis with rheumatoid factor of multiple sites without organ or systems involvement: Secondary | ICD-10-CM | POA: Diagnosis not present

## 2017-09-05 DIAGNOSIS — Z79899 Other long term (current) drug therapy: Secondary | ICD-10-CM | POA: Diagnosis not present

## 2017-12-22 DIAGNOSIS — Z79899 Other long term (current) drug therapy: Secondary | ICD-10-CM | POA: Diagnosis not present

## 2017-12-22 DIAGNOSIS — M0579 Rheumatoid arthritis with rheumatoid factor of multiple sites without organ or systems involvement: Secondary | ICD-10-CM | POA: Diagnosis not present

## 2018-01-01 DIAGNOSIS — Z79899 Other long term (current) drug therapy: Secondary | ICD-10-CM | POA: Diagnosis not present

## 2018-01-01 DIAGNOSIS — M79642 Pain in left hand: Secondary | ICD-10-CM | POA: Diagnosis not present

## 2018-01-01 DIAGNOSIS — M0579 Rheumatoid arthritis with rheumatoid factor of multiple sites without organ or systems involvement: Secondary | ICD-10-CM | POA: Diagnosis not present

## 2018-01-01 DIAGNOSIS — M79641 Pain in right hand: Secondary | ICD-10-CM | POA: Diagnosis not present

## 2018-01-02 DIAGNOSIS — H353131 Nonexudative age-related macular degeneration, bilateral, early dry stage: Secondary | ICD-10-CM | POA: Diagnosis not present

## 2018-01-23 ENCOUNTER — Ambulatory Visit: Payer: PPO

## 2018-02-06 ENCOUNTER — Ambulatory Visit: Payer: PPO

## 2018-02-21 DIAGNOSIS — M0579 Rheumatoid arthritis with rheumatoid factor of multiple sites without organ or systems involvement: Secondary | ICD-10-CM | POA: Diagnosis not present

## 2018-02-21 DIAGNOSIS — Z79899 Other long term (current) drug therapy: Secondary | ICD-10-CM | POA: Diagnosis not present

## 2018-02-28 DIAGNOSIS — M0579 Rheumatoid arthritis with rheumatoid factor of multiple sites without organ or systems involvement: Secondary | ICD-10-CM | POA: Diagnosis not present

## 2018-02-28 DIAGNOSIS — M79641 Pain in right hand: Secondary | ICD-10-CM | POA: Diagnosis not present

## 2018-04-30 DIAGNOSIS — M0579 Rheumatoid arthritis with rheumatoid factor of multiple sites without organ or systems involvement: Secondary | ICD-10-CM | POA: Diagnosis not present

## 2018-04-30 DIAGNOSIS — Z79899 Other long term (current) drug therapy: Secondary | ICD-10-CM | POA: Diagnosis not present

## 2018-04-30 DIAGNOSIS — M65321 Trigger finger, right index finger: Secondary | ICD-10-CM | POA: Diagnosis not present

## 2018-06-20 DIAGNOSIS — Z79899 Other long term (current) drug therapy: Secondary | ICD-10-CM | POA: Diagnosis not present

## 2018-06-20 DIAGNOSIS — M0579 Rheumatoid arthritis with rheumatoid factor of multiple sites without organ or systems involvement: Secondary | ICD-10-CM | POA: Diagnosis not present

## 2018-07-17 ENCOUNTER — Ambulatory Visit (INDEPENDENT_AMBULATORY_CARE_PROVIDER_SITE_OTHER): Payer: PPO

## 2018-07-17 VITALS — BP 138/76 | HR 84 | Temp 98.8°F | Resp 16 | Ht 64.0 in | Wt 130.0 lb

## 2018-07-17 DIAGNOSIS — Z0000 Encounter for general adult medical examination without abnormal findings: Secondary | ICD-10-CM

## 2018-07-17 NOTE — Progress Notes (Signed)
Subjective:   Amber Parker is a 67 y.o. female who presents for Medicare Annual (Subsequent) preventive examination.  Review of Systems:       Objective:     Vitals: BP 138/76 (BP Location: Left Arm, Patient Position: Sitting)   Pulse 84   Temp 98.8 F (37.1 C) (Oral)   Resp 16   Ht 5\' 4"  (1.626 m)   Wt 130 lb (59 kg)   BMI 22.31 kg/m   Body mass index is 22.31 kg/m.  Advanced Directives 07/17/2018 12/26/2016  Does Patient Have a Medical Advance Directive? No No  Would patient like information on creating a medical advance directive? Yes (MAU/Ambulatory/Procedural Areas - Information given) No - Patient declined    Tobacco Social History   Tobacco Use  Smoking Status Never Smoker  Smokeless Tobacco Never Used     Counseling given: Not Answered   Clinical Intake:  Pre-visit preparation completed: Yes  Pain : 0-10 Pain Score: 4  Pain Type: Acute pain Pain Location: Head Pain Descriptors / Indicators: Aching Pain Onset: Yesterday Pain Frequency: Intermittent     Nutritional Status: BMI of 19-24  Normal Nutritional Risks: None Diabetes: No  How often do you need to have someone help you when you read instructions, pamphlets, or other written materials from your doctor or pharmacy?: 1 - Never What is the last grade level you completed in school?: high school   Interpreter Needed?: No  Information entered by :: Tiffany Hill,LPN   Past Medical History:  Diagnosis Date  . Carotid artery stenosis    BILATERAL  . Hyperlipidemia    Past Surgical History:  Procedure Laterality Date  . CHOLECYSTECTOMY    . COLONOSCOPY WITH PROPOFOL N/A 12/26/2016   Procedure: COLONOSCOPY WITH PROPOFOL;  Surgeon: Manya Silvas, MD;  Location: [facility] ENDOSCOPY;  Service: Endoscopy;  Laterality: N/A;  . OOPHRECTOMY Bilateral    PARTIAL REMOVAL OF BOTH OVARIES  . TONSILLECTOMY     Family History  Problem Relation Age of Onset  . Breast cancer Paternal Aunt 59  .  Hypertension Mother   . Hyperlipidemia Mother   . Stroke Mother   . Heart disease Mother   . Cancer Mother        lung cancer  . Diabetes Mother   . COPD Mother   . Heart attack Mother   . Neurologic Disorder Father   . Cancer Father        colon cancer   Social History   Socioeconomic History  . Marital status: Married    Spouse name: Not on file  . Number of children: Not on file  . Years of education: Not on file  . Highest education level: High school graduate  Occupational History  . Occupation: retired  Scientific laboratory technician  . Financial resource strain: Not hard at all  . Food insecurity:    Worry: Never true    Inability: Never true  . Transportation needs:    Medical: No    Non-medical: No  Tobacco Use  . Smoking status: Never Smoker  . Smokeless tobacco: Never Used  Substance and Sexual Activity  . Alcohol use: No  . Drug use: No  . Sexual activity: Not on file  Lifestyle  . Physical activity:    Days per week: 0 days    Minutes per session: 0 min  . Stress: Not at all  Relationships  . Social connections:    Talks on phone: More than three times a week  Gets together: More than three times a week    Attends religious service: More than 4 times per year    Active member of club or organization: No    Attends meetings of clubs or organizations: Never    Relationship status: Married  Other Topics Concern  . Not on file  Social History Narrative  . Not on file    Outpatient Encounter Medications as of 07/17/2018  Medication Sig  . diclofenac sodium (VOLTAREN) 1 % GEL Apply 2 g topically 4 (four) times daily as needed. For use on hands for arthritis, up to 1-2 weeks per flare  . methotrexate 50 MG/2ML injection Inject into the skin.  . naproxen sodium (ALEVE) 220 MG tablet Take 220 mg by mouth.  . sodium chloride (OCEAN) 0.65 % SOLN nasal spray Place 1 spray into both nostrils as needed for congestion.   No facility-administered encounter medications on  file as of 07/17/2018.     Activities of Daily Living In your present state of health, do you have any difficulty performing the following activities: 07/17/2018  Hearing? N  Vision? N  Difficulty concentrating or making decisions? N  Walking or climbing stairs? N  Dressing or bathing? N  Doing errands, shopping? N  Preparing Food and eating ? N  Using the Toilet? N  In the past six months, have you accidently leaked urine? N  Do you have problems with loss of bowel control? N  Managing your Medications? N  Managing your Finances? N  Housekeeping or managing your Housekeeping? N  Some recent data might be hidden    Patient Care Team: Olin Hauser, DO as PCP - General (Family Medicine) Manya Silvas, MD (Gastroenterology) Emmaline Kluver., MD (Rheumatology) Corey Skains, MD as Consulting Physician (Cardiology)    Assessment:   This is a routine wellness examination for [place].  Exercise Activities and Dietary recommendations Current Exercise Habits: The patient does not participate in regular exercise at present, Exercise limited by: None identified  Goals    . DIET - INCREASE WATER INTAKE     Recommend drinking at least 6-8 glasses of water a day        Fall Risk Fall Risk  07/17/2018 02/01/2017  Falls in the past year? No No   Is the patient's home free of loose throw rugs in walkways, pet beds, electrical cords, etc?   yes      Grab bars in the bathroom? no      Handrails on the stairs?   no stairs       Adequate lighting?   yes  Timed Get Up and Go performed: Completed in 8 seconds with no use of assistive devices, steady gait. No intervention needed at this time.   Depression Screen PHQ 2/9 Scores 07/17/2018 02/01/2017  PHQ - 2 Score 0 0     Cognitive Function     6CIT Screen 07/17/2018  What Year? 0 points  What month? 0 points  What time? 0 points  Count back from 20 0 points  Months in reverse 0 points  Repeat phrase 0 points    Total Score 0    Immunization History  Administered Date(s) Administered  . Influenza, High Dose Seasonal PF 02/01/2017  . Pneumococcal Conjugate-13 02/01/2017  . Tdap 01/13/2016   Declined pneumovax 23 today, she will get it next time she is in the office.    Qualifies for Shingles Vaccine? Yes, discussed shingrix vaccine   Screening Tests  Health Maintenance  Topic Date Due  . Hepatitis C Screening  02/12/1951  . PNA vac Low Risk Adult (2 of 2 - PPSV23) 02/01/2018  . INFLUENZA VACCINE  07/12/2018  . MAMMOGRAM  09/26/2018  . TETANUS/TDAP  01/12/2026  . COLONOSCOPY  12/26/2026  . DEXA SCAN  Completed    Cancer Screenings: Lung: Low Dose CT Chest recommended if Age 9-80 years, 30 pack-year currently smoking OR have quit w/in 15years. Patient does not qualify. Breast:  Up to date on Mammogram? Yes  09/26/2016, patient believes she is supposed to have a 3D mammogram this year due to density. She will call her insurance to check on coverage  Up to date of Bone Density/Dexa? Yes 12/12/2006 Colorectal: completed 12/26/2016  Additional Screenings:  Hepatitis C Screening: will order for future lab work      Plan:    I have personally reviewed and addressed the Medicare Annual Wellness questionnaire and have noted the following in the patient's chart:  A. Medical and social history B. Use of alcohol, tobacco or illicit drugs  C. Current medications and supplements D. Functional ability and status E.  Nutritional status F.  Physical activity G. Advance directives H. List of other physicians I.  Hospitalizations, surgeries, and ER visits in previous 12 months J.  [place] such as hearing and vision if needed, cognitive and depression L. Referrals and appointments   In addition, I have reviewed and discussed with patient certain preventive protocols, quality metrics, and best practice recommendations. A written personalized care plan for preventive services as well  as general preventive health recommendations were provided to patient.   Signed,  Tyler Aas, LPN Nurse Health Advisor   Nurse Notes:none

## 2018-07-17 NOTE — Patient Instructions (Addendum)
Amber Parker , Thank you for taking time to come for your Medicare Wellness Visit. I appreciate your ongoing commitment to your health goals. Please review the following plan we discussed and let me know if I can assist you in the future.   Screening recommendations/referrals: Colonoscopy: completed 12/26/2016 Mammogram: due 09/26/2018, Please call [phone] to schedule your mammogram.  Bone Density: completed Recommended yearly ophthalmology/optometry visit for glaucoma screening and checkup Recommended yearly dental visit for hygiene and checkup  Vaccinations: Influenza vaccine: due 08/2018 Pneumococcal vaccine: pneumovax 23 due, declined today  Tdap vaccine:up to date  Shingles vaccine: shingrix eligible, check with your insurance company for coverage   Advanced directives: Advance directive discussed with you today. I have provided a copy for you to complete at home and have notarized. Once this is complete please bring a copy in to our office so we can scan it into your chart.  Conditions/risks identified: Recommend drinking at least 6-8 glasses of water a day   Next appointment: Follow up in one year for your annual wellness exam.    Preventive Care 65 Years and Older, Female Preventive care refers to lifestyle choices and visits with your health care provider that can promote health and wellness. What does preventive care include?  A yearly physical exam. This is also called an annual well check.  Dental exams once or twice a year.  Routine eye exams. Ask your health care provider how often you should have your eyes checked.  Personal lifestyle choices, including:  Daily care of your teeth and gums.  Regular physical activity.  Eating a healthy diet.  Avoiding tobacco and drug use.  Limiting alcohol use.  Practicing safe sex.  Taking low-dose aspirin every day.  Taking vitamin and mineral supplements as recommended by your health care provider. What happens  during an annual well check? The services and screenings done by your health care provider during your annual well check will depend on your age, overall health, lifestyle risk factors, and family history of disease. Counseling  Your health care provider may ask you questions about your:  Alcohol use.  Tobacco use.  Drug use.  Emotional well-being.  Home and relationship well-being.  Sexual activity.  Eating habits.  History of falls.  Memory and ability to understand (cognition).  Work and work Statistician.  Reproductive health. Screening  You may have the following tests or measurements:  Height, weight, and BMI.  Blood pressure.  Lipid and cholesterol levels. These may be checked every 5 years, or more frequently if you are over 103 years old.  Skin check.  Lung cancer screening. You may have this screening every year starting at age 3 if you have a 30-pack-year history of smoking and currently smoke or have quit within the past 15 years.  Fecal occult blood test (FOBT) of the stool. You may have this test every year starting at age 73.  Flexible sigmoidoscopy or colonoscopy. You may have a sigmoidoscopy every 5 years or a colonoscopy every 10 years starting at age 2.  Hepatitis C blood test.  Hepatitis B blood test.  Sexually transmitted disease (STD) testing.  Diabetes screening. This is done by checking your blood sugar (glucose) after you have not eaten for a while (fasting). You may have this done every 1-3 years.  Bone density scan. This is done to screen for osteoporosis. You may have this done starting at age 75.  Mammogram. This may be done every 1-2 years. Talk to your health care  provider about how often you should have regular mammograms. Talk with your health care provider about your test results, treatment options, and if necessary, the need for more tests. Vaccines  Your health care provider may recommend certain vaccines, such  as:  Influenza vaccine. This is recommended every year.  Tetanus, diphtheria, and acellular pertussis (Tdap, Td) vaccine. You may need a Td booster every 10 years.  Zoster vaccine. You may need this after age 45.  Pneumococcal 13-valent conjugate (PCV13) vaccine. One dose is recommended after age 64.  Pneumococcal polysaccharide (PPSV23) vaccine. One dose is recommended after age 24. Talk to your health care provider about which screenings and vaccines you need and how often you need them. This information is not intended to replace advice given to you by your health care provider. Make sure you discuss any questions you have with your health care provider. Document Released: 12/25/2015 Document Revised: 08/17/2016 Document Reviewed: 09/29/2015 Elsevier Interactive Patient Education  2017 [place] Prevention in the Home Falls can cause injuries. They can happen to people of all ages. There are many things you can do to make your home safe and to help prevent falls. What can I do on the outside of my home?  Regularly fix the edges of walkways and driveways and fix any cracks.  Remove anything that might make you trip as you walk through a door, such as a raised step or threshold.  Trim any bushes or trees on the path to your home.  Use bright outdoor lighting.  Clear any walking paths of anything that might make someone trip, such as rocks or tools.  Regularly check to see if handrails are loose or broken. Make sure that both sides of any steps have handrails.  Any raised decks and porches should have guardrails on the edges.  Have any leaves, snow, or ice cleared regularly.  Use sand or salt on walking paths during winter.  Clean up any spills in your garage right away. This includes oil or grease spills. What can I do in the bathroom?  Use night lights.  Install grab bars by the toilet and in the tub and shower. Do not use towel bars as grab bars.  Use  non-skid mats or decals in the tub or shower.  If you need to sit down in the shower, use a plastic, non-slip stool.  Keep the floor dry. Clean up any water that spills on the floor as soon as it happens.  Remove soap buildup in the tub or shower regularly.  Attach bath mats securely with double-sided non-slip rug tape.  Do not have throw rugs and other things on the floor that can make you trip. What can I do in the bedroom?  Use night lights.  Make sure that you have a light by your bed that is easy to reach.  Do not use any sheets or blankets that are too big for your bed. They should not hang down onto the floor.  Have a firm chair that has side arms. You can use this for support while you get dressed.  Do not have throw rugs and other things on the floor that can make you trip. What can I do in the kitchen?  Clean up any spills right away.  Avoid walking on wet floors.  Keep items that you use a lot in easy-to-reach places.  If you need to reach something above you, use a strong step stool that has a grab bar.  Keep electrical cords out of the way.  Do not use floor polish or wax that makes floors slippery. If you must use wax, use non-skid floor wax.  Do not have throw rugs and other things on the floor that can make you trip. What can I do with my stairs?  Do not leave any items on the stairs.  Make sure that there are handrails on both sides of the stairs and use them. Fix handrails that are broken or loose. Make sure that handrails are as long as the stairways.  Check any carpeting to make sure that it is firmly attached to the stairs. Fix any carpet that is loose or worn.  Avoid having throw rugs at the top or bottom of the stairs. If you do have throw rugs, attach them to the floor with carpet tape.  Make sure that you have a light switch at the top of the stairs and the bottom of the stairs. If you do not have them, ask someone to add them for you. What  else can I do to help prevent falls?  Wear shoes that:  Do not have high heels.  Have rubber bottoms.  Are comfortable and fit you well.  Are closed at the toe. Do not wear sandals.  If you use a stepladder:  Make sure that it is fully opened. Do not climb a closed stepladder.  Make sure that both sides of the stepladder are locked into place.  Ask someone to hold it for you, if possible.  Clearly mark and make sure that you can see:  Any grab bars or handrails.  First and last steps.  Where the edge of each step is.  Use tools that help you move around (mobility aids) if they are needed. These include:  Canes.  Walkers.  Scooters.  Crutches.  Turn on the lights when you go into a dark area. Replace any light bulbs as soon as they burn out.  Set up your furniture so you have a clear path. Avoid moving your furniture around.  If any of your floors are uneven, fix them.  If there are any pets around you, be aware of where they are.  Review your medicines with your doctor. Some medicines can make you feel dizzy. This can increase your chance of falling. Ask your doctor what other things that you can do to help prevent falls. This information is not intended to replace advice given to you by your health care provider. Make sure you discuss any questions you have with your health care provider. Document Released: 09/24/2009 Document Revised: 05/05/2016 Document Reviewed: 01/02/2015 Elsevier Interactive Patient Education  2017 [employer].

## 2018-09-12 ENCOUNTER — Other Ambulatory Visit: Payer: Self-pay | Admitting: Internal Medicine

## 2018-09-12 ENCOUNTER — Other Ambulatory Visit: Payer: Self-pay | Admitting: Family Medicine

## 2018-09-12 DIAGNOSIS — Z1231 Encounter for screening mammogram for malignant neoplasm of breast: Secondary | ICD-10-CM

## 2018-09-13 ENCOUNTER — Other Ambulatory Visit: Payer: Self-pay | Admitting: Family Medicine

## 2018-09-13 DIAGNOSIS — G8929 Other chronic pain: Secondary | ICD-10-CM

## 2018-09-13 DIAGNOSIS — Z0000 Encounter for general adult medical examination without abnormal findings: Secondary | ICD-10-CM

## 2018-09-13 DIAGNOSIS — I6523 Occlusion and stenosis of bilateral carotid arteries: Secondary | ICD-10-CM

## 2018-09-13 DIAGNOSIS — Z1159 Encounter for screening for other viral diseases: Secondary | ICD-10-CM

## 2018-09-13 DIAGNOSIS — E782 Mixed hyperlipidemia: Secondary | ICD-10-CM

## 2018-09-13 DIAGNOSIS — M25561 Pain in right knee: Secondary | ICD-10-CM

## 2018-09-13 DIAGNOSIS — M25562 Pain in left knee: Secondary | ICD-10-CM

## 2018-09-25 ENCOUNTER — Ambulatory Visit
Admission: RE | Admit: 2018-09-25 | Discharge: 2018-09-25 | Disposition: A | Discharge status: Home / Self Care | Payer: PPO | Source: Ambulatory Visit | Attending: Family Medicine | Admitting: Family Medicine

## 2018-09-25 DIAGNOSIS — Z1231 Encounter for screening mammogram for malignant neoplasm of breast: Secondary | ICD-10-CM

## 2018-09-26 ENCOUNTER — Other Ambulatory Visit: Payer: PPO

## 2018-09-26 DIAGNOSIS — I6523 Occlusion and stenosis of bilateral carotid arteries: Secondary | ICD-10-CM | POA: Diagnosis not present

## 2018-09-26 DIAGNOSIS — Z1159 Encounter for screening for other viral diseases: Secondary | ICD-10-CM

## 2018-09-26 DIAGNOSIS — E782 Mixed hyperlipidemia: Secondary | ICD-10-CM

## 2018-09-26 DIAGNOSIS — Z0000 Encounter for general adult medical examination without abnormal findings: Secondary | ICD-10-CM

## 2018-09-26 DIAGNOSIS — M25561 Pain in right knee: Secondary | ICD-10-CM | POA: Diagnosis not present

## 2018-09-26 DIAGNOSIS — M25562 Pain in left knee: Secondary | ICD-10-CM | POA: Diagnosis not present

## 2018-09-26 DIAGNOSIS — G8929 Other chronic pain: Secondary | ICD-10-CM

## 2018-09-27 LAB — LIPID PANEL
CHOLESTEROL: 294 mg/dL — AB (ref <200)
HDL: 64 mg/dL (ref >50)
LDL CHOLESTEROL (CALC): 199 mg/dL — AB
Non-HDL Cholesterol (Calc): 230 mg/dL (calc) — ABNORMAL HIGH (ref <130)
Total CHOL/HDL Ratio: 4.6 (calc) (ref <5.0)
Triglycerides: 150 mg/dL — ABNORMAL HIGH (ref <150)

## 2018-09-27 LAB — COMPLETE METABOLIC PANEL WITH GFR
AG Ratio: 1.6 (calc) (ref 1.0–2.5)
ALBUMIN MSPROF: 4.6 g/dL (ref 3.6–5.1)
ALT: 25 U/L (ref 6–29)
AST: 26 U/L (ref 10–35)
Alkaline phosphatase (APISO): 69 U/L (ref 33–130)
BUN: 16 mg/dL (ref 7–25)
CALCIUM: 10.1 mg/dL (ref 8.6–10.4)
CO2: 31 mmol/L (ref 20–32)
Chloride: 100 mmol/L (ref 98–110)
Creat: 0.66 mg/dL (ref 0.50–0.99)
GFR, EST NON AFRICAN AMERICAN: 90 mL/min/{1.73_m2} (ref >=60)
GFR, Est African American: 104 mL/min/{1.73_m2} (ref >=60)
GLOBULIN: 2.9 g/dL (ref 1.9–3.7)
Glucose, Bld: 83 mg/dL (ref 65–99)
Potassium: 4.3 mmol/L (ref 3.5–5.3)
SODIUM: 139 mmol/L (ref 135–146)
Total Bilirubin: 0.5 mg/dL (ref 0.2–1.2)
Total Protein: 7.5 g/dL (ref 6.1–8.1)

## 2018-09-27 LAB — CBC WITH DIFFERENTIAL/PLATELET
BASOS ABS: 32 {cells}/uL (ref 0–200)
BASOS PCT: 0.6 %
EOS ABS: 130 {cells}/uL (ref 15–500)
Eosinophils Relative: 2.4 %
HCT: 36.6 % (ref 35.0–45.0)
Hemoglobin: 12.3 g/dL (ref 11.7–15.5)
Lymphs Abs: 1501 cells/uL (ref 850–3900)
MCH: 30 pg (ref 27.0–33.0)
MCHC: 33.6 g/dL (ref 32.0–36.0)
MCV: 89.3 fL (ref 80.0–100.0)
MPV: 9.2 fL (ref 7.5–12.5)
Monocytes Relative: 9.6 %
NEUTROS PCT: 59.6 %
Neutro Abs: 3218 cells/uL (ref 1500–7800)
PLATELETS: 340 10*3/uL (ref 140–400)
RBC: 4.1 10*6/uL (ref 3.80–5.10)
RDW: 13.6 % (ref 11.0–15.0)
TOTAL LYMPHOCYTE: 27.8 %
WBC: 5.4 10*3/uL (ref 3.8–10.8)
WBCMIX: 518 {cells}/uL (ref 200–950)

## 2018-09-27 LAB — HEMOGLOBIN A1C
EAG (MMOL/L): 5.7 (calc)
Hgb A1c MFr Bld: 5.2 % of total Hgb (ref <5.7)
Mean Plasma Glucose: 103 (calc)

## 2018-09-27 LAB — HEPATITIS C ANTIBODY
Hepatitis C Ab: NONREACTIVE
SIGNAL TO CUT-OFF: 0.03 (ref <1.00)

## 2018-10-03 ENCOUNTER — Ambulatory Visit (INDEPENDENT_AMBULATORY_CARE_PROVIDER_SITE_OTHER): Payer: PPO | Admitting: Family Medicine

## 2018-10-03 ENCOUNTER — Other Ambulatory Visit: Payer: Self-pay | Admitting: Family Medicine

## 2018-10-03 ENCOUNTER — Encounter: Payer: Self-pay | Admitting: Family Medicine

## 2018-10-03 VITALS — BP 115/63 | HR 88 | Temp 98.4°F | Resp 16 | Ht 65.0 in | Wt 129.0 lb

## 2018-10-03 DIAGNOSIS — M62838 Other muscle spasm: Secondary | ICD-10-CM

## 2018-10-03 DIAGNOSIS — M0579 Rheumatoid arthritis with rheumatoid factor of multiple sites without organ or systems involvement: Secondary | ICD-10-CM

## 2018-10-03 DIAGNOSIS — E782 Mixed hyperlipidemia: Secondary | ICD-10-CM

## 2018-10-03 DIAGNOSIS — M069 Rheumatoid arthritis, unspecified: Secondary | ICD-10-CM | POA: Insufficient documentation

## 2018-10-03 DIAGNOSIS — Z0000 Encounter for general adult medical examination without abnormal findings: Secondary | ICD-10-CM | POA: Diagnosis not present

## 2018-10-03 DIAGNOSIS — Z79899 Other long term (current) drug therapy: Secondary | ICD-10-CM

## 2018-10-03 MED ORDER — BACLOFEN 10 MG PO TABS: 5.0000 mg | ORAL_TABLET | Freq: Three times a day (TID) | ORAL | 0 refills | Status: DC | PRN

## 2018-10-03 NOTE — Progress Notes (Signed)
Subjective:    Patient ID: Amber Parker, female    DOB: 1951/10/29, 67 y.o.   MRN: [id number]  Amber Parker is a 67 y.o. female presenting on 10/03/2018 for Annual Exam   HPI   Here for Annual Physical and Lab Review   HYPERLIPIDEMIA Recent lab shows elevated LDL still, long term problem, she missed last Cardiology follow-up. She will see Dr Nehemiah Massed to discuss further. Reports chronic history since age 47s, has been on most statin medications with intolerance due to myalgias - Last lipids 09/2018 show elevated lipids LDL 199 - Not on cholesterol med currently  Chronic Rhinosinusitis - Doesnt use Flonase, uses nasal saline several times - Considering anti-histamine  Follow-up Rheumatoid Arthritis Rheumatoid Arthritis / Back of Neck, tightness - had prior injury w/ sprain years ago, now that she is taking Methotrexate, and RA, she is asking if it is more related to rheumatoid arthritis or old injury. She has had some worsening in past 6 months, asking about this, improve with heat, next apt with Rheumatology in November 2019. 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  Additional complaints: - Lower extremity redness, rash - recent problem uncertain onset maybe several months ago, noticed several areas, not painful or itching or spreading. One spot non healing.   Health Maintenance: Declines flu vaccine today, will return if plan to get Defers 2nd PNA vaccine, pneumovax-23, initial dose Prevnar-13 02/01/17, she will discuss with Rheumatology next UTD Hep C - UTD Mammogram - UTD Colonoscopy   Depression screen [facility] 2/9 10/03/2018 07/17/2018 02/01/2017  Decreased Interest 0 0 0  Down, Depressed, Hopeless 0 0 0  PHQ - 2 Score 0 0 0    Past Medical History:  Diagnosis Date  . Carotid artery stenosis    BILATERAL  . Hyperlipidemia    Past Surgical History:  Procedure Laterality Date  . CHOLECYSTECTOMY    . COLONOSCOPY WITH PROPOFOL N/A 12/26/2016   Procedure:  COLONOSCOPY WITH PROPOFOL;  Surgeon: Manya Silvas, MD;  Location: [facility] ENDOSCOPY;  Service: Endoscopy;  Laterality: N/A;  . OOPHRECTOMY Bilateral    PARTIAL REMOVAL OF BOTH OVARIES  . TONSILLECTOMY     Social History   Socioeconomic History  . Marital status: Married    Spouse name: Not on file  . Number of children: Not on file  . Years of education: Not on file  . Highest education level: High school graduate  Occupational History  . Occupation: retired  Scientific laboratory technician  . Financial resource strain: Not hard at all  . Food insecurity:    Worry: Never true    Inability: Never true  . Transportation needs:    Medical: No    Non-medical: No  Tobacco Use  . Smoking status: Never Smoker  . Smokeless tobacco: Never Used  Substance and Sexual Activity  . Alcohol use: No  . Drug use: No  . Sexual activity: Not on file  Lifestyle  . Physical activity:    Days per week: 0 days    Minutes per session: 0 min  . Stress: Not at all  Relationships  . Social connections:    Talks on phone: More than three times a week    Gets together: More than three times a week    Attends religious service: More than 4 times per year    Active member of club or organization: No    Attends meetings of clubs or organizations: Never    Relationship status: Married  .  Intimate partner violence:    Fear of current or ex partner: No    Emotionally abused: No    Physically abused: No    Forced sexual activity: No  Other Topics Concern  . Not on file  Social History Narrative  . Not on file   Family History  Problem Relation Age of Onset  . Breast cancer Paternal Aunt 52  . Hypertension Mother   . Hyperlipidemia Mother   . Stroke Mother   . Heart disease Mother   . Cancer Mother        lung cancer  . Diabetes Mother   . COPD Mother   . Heart attack Mother   . Neurologic Disorder Father   . Cancer Father        colon cancer   Current Outpatient Medications on File Prior to Visit    Medication Sig  . aspirin EC 81 MG tablet Take 81 mg by mouth daily.  . diclofenac sodium (VOLTAREN) 1 % GEL Apply 2 g topically 4 (four) times daily as needed. For use on hands for arthritis, up to 1-2 weeks per flare  . folic acid (FOLVITE) 1 MG tablet Take by mouth.  . methotrexate 50 MG/2ML injection Inject into the skin.  . naproxen sodium (ALEVE) 220 MG tablet Take 220 mg by mouth.  . sodium chloride (OCEAN) 0.65 % SOLN nasal spray Place 1 spray into both nostrils as needed for congestion.   No current facility-administered medications on file prior to visit.     Review of Systems  Constitutional: Negative for activity change, appetite change, chills, diaphoresis, fatigue and fever.  HENT: Negative for congestion and hearing loss.   Eyes: Negative for visual disturbance.  Respiratory: Negative for apnea, cough, choking, chest tightness, shortness of breath and wheezing.   Cardiovascular: Negative for chest pain, palpitations and leg swelling.  Gastrointestinal: Negative for abdominal pain, anal bleeding, blood in stool, constipation, diarrhea, nausea and vomiting.  Endocrine: Negative for cold intolerance.  Genitourinary: Negative for difficulty urinating, dysuria, frequency and hematuria.  Musculoskeletal: Positive for neck stiffness. Negative for arthralgias, back pain and neck pain.  Skin: Positive for rash (lower extremity).  Allergic/Immunologic: Negative for environmental allergies.  Neurological: Negative for dizziness, weakness, light-headedness, numbness and headaches.  Hematological: Negative for adenopathy.  Psychiatric/Behavioral: Negative for behavioral problems, dysphoric mood and sleep disturbance.   Per HPI unless specifically indicated above     Objective:    BP 115/63   Pulse 88   Temp 98.4 F (36.9 C) (Oral)   Resp 16   Ht 5\' 5"  (1.651 m)   Wt 129 lb (58.5 kg)   BMI 21.47 kg/m   Wt Readings from Last 3 Encounters:  10/03/18 129 lb (58.5 kg)   07/17/18 130 lb (59 kg)  05/10/17 131 lb (59.4 kg)    Physical Exam  Constitutional: She is oriented to person, place, and time. She appears well-developed and well-nourished. No distress.  Well-appearing, comfortable, cooperative  HENT:  Head: Normocephalic and atraumatic.  Mouth/Throat: Oropharynx is clear and moist.  Frontal / maxillary sinuses non-tender. Nares patent without purulence or edema. Bilateral TMs clear without erythema, effusion or bulging. Oropharynx clear without erythema, exudates, edema or asymmetry.  Eyes: Pupils are equal, round, and reactive to light. Conjunctivae and EOM are normal. Right eye exhibits no discharge. Left eye exhibits no discharge.  Neck: Normal range of motion. Neck supple. No thyromegaly present.  Cardiovascular: Normal rate, regular rhythm, normal heart sounds and intact distal  pulses.  No murmur heard. Pulmonary/Chest: Effort normal and breath sounds normal. No respiratory distress. She has no wheezes. She has no rales.  Abdominal: Soft. Bowel sounds are normal. She exhibits no distension and no mass. There is no tenderness.  Musculoskeletal: Normal range of motion. She exhibits no edema or tenderness.  Upper / Lower Extremities: - Normal muscle tone, strength bilateral upper extremities 5/5, lower extremities 5/5  Lymphadenopathy:    She has no cervical adenopathy.  Neurological: She is alert and oriented to person, place, and time.  Distal sensation intact to light touch all extremities  Skin: Skin is warm and dry. Rash (lower extremity R mid shin anterior with 2 cm area of slightly erythematous appearance, slightly rough feeling skin, other areas of punctate petechia very few areas) noted. She is not diaphoretic. No erythema.  Psychiatric: She has a normal mood and affect. Her behavior is normal.  Well groomed, good eye contact, normal speech and thoughts  Nursing note and vitals reviewed.  Results for orders placed or performed in visit on  09/26/18  Lipid panel  Result Value Ref Range   Cholesterol 294 (H) <200 mg/dL   HDL 64 >50 mg/dL   Triglycerides 150 (H) <150 mg/dL   LDL Cholesterol (Calc) 199 (H) mg/dL (calc)   Total CHOL/HDL Ratio 4.6 <5.0 (calc)   Non-HDL Cholesterol (Calc) 230 (H) <130 mg/dL (calc)  Hepatitis C antibody  Result Value Ref Range   Hepatitis C Ab NON-REACTIVE NON-REACTI   SIGNAL TO CUT-OFF 0.03 <1.00  COMPLETE METABOLIC PANEL WITH GFR  Result Value Ref Range   Glucose, Bld 83 65 - 99 mg/dL   BUN 16 7 - 25 mg/dL   Creat 0.66 0.50 - 0.99 mg/dL   GFR, Est Non African American 90 > OR = 60 mL/min/1.80m2   GFR, Est African American 104 > OR = 60 mL/min/1.33m2   BUN/Creatinine Ratio NOT APPLICABLE 6 - 22 (calc)   Sodium 139 135 - 146 mmol/L   Potassium 4.3 3.5 - 5.3 mmol/L   Chloride 100 98 - 110 mmol/L   CO2 31 20 - 32 mmol/L   Calcium 10.1 8.6 - 10.4 mg/dL   Total Protein 7.5 6.1 - 8.1 g/dL   Albumin 4.6 3.6 - 5.1 g/dL   Globulin 2.9 1.9 - 3.7 g/dL (calc)   AG Ratio 1.6 1.0 - 2.5 (calc)   Total Bilirubin 0.5 0.2 - 1.2 mg/dL   Alkaline phosphatase (APISO) 69 33 - 130 U/L   AST 26 10 - 35 U/L   ALT 25 6 - 29 U/L  CBC with Differential/Platelet  Result Value Ref Range   WBC 5.4 3.8 - 10.8 Thousand/uL   RBC 4.10 3.80 - 5.10 Million/uL   Hemoglobin 12.3 11.7 - 15.5 g/dL   HCT 36.6 35.0 - 45.0 %   MCV 89.3 80.0 - 100.0 fL   MCH 30.0 27.0 - 33.0 pg   MCHC 33.6 32.0 - 36.0 g/dL   RDW 13.6 11.0 - 15.0 %   Platelets 340 140 - 400 Thousand/uL   MPV 9.2 7.5 - 12.5 fL   Neutro Abs 3,218 1,500 - 7,800 cells/uL   Lymphs Abs 1,501 850 - 3,900 cells/uL   WBC mixed population 518 200 - 950 cells/uL   Eosinophils Absolute 130 15 - 500 cells/uL   Basophils Absolute 32 0 - 200 cells/uL   Neutrophils Relative % 59.6 %   Total Lymphocyte 27.8 %   Monocytes Relative 9.6 %   Eosinophils Relative  2.4 %   Basophils Relative 0.6 %  Hemoglobin A1c  Result Value Ref Range   Hgb A1c MFr Bld 5.2 <5.7 % of  total Hgb   Mean Plasma Glucose 103 (calc)   eAG (mmol/L) 5.7 (calc)      Assessment & Plan:   Problem List Items Addressed This Visit    Hyperlipidemia    Chronic problem elevated LDL lipids Last lipid 09/2018 - Long history of statin intolerance with myalgias (failed atorvastatin, rosuvastatin, simvastatin, and others) - Some family history of HLD, Heart Disease, CVA  Plan: 1. Recommend consider future PCSK9 inhibitor - may followup with cardiology to discuss further      Relevant Medications   aspirin EC 81 MG tablet   Rheumatoid arthritis ([place])    Clinically with chronic RA, stable without current flare up Has persistent symptoms in hand joints affecting function if overuse Followed by Rheumatology Continues on MTX Trial on muscle relaxant for neck stiffness symptoms      Relevant Medications   aspirin EC 81 MG tablet   baclofen (LIORESAL) 10 MG tablet    Other Visit Diagnoses    Annual physical exam    -  Primary Updated Health Maintenance information Reviewed recent lab results with patient Encouraged improvement to lifestyle with diet and exercise    Neck muscle spasm       Relevant Medications   baclofen (LIORESAL) 10 MG tablet      Meds ordered this encounter  Medications  . baclofen (LIORESAL) 10 MG tablet    Sig: Take 0.5-1 tablets (5-10 mg total) by mouth 3 (three) times daily as needed for muscle spasms.    Dispense:  30 each    Refill:  0    Follow up plan: Return in about 1 year (around 10/04/2019) for Annual Physical.  Future labs ordered for 09/2019  Nobie Putnam, [facility] 10/03/2018, 9:43 AM

## 2018-10-03 NOTE — Patient Instructions (Addendum)
Thank you for coming to the office today.  Ask Dr Jefm Bryant about future treatment options for RA given your symptoms  Also due for Pneumovax-23 - 2nd and final booster for Pneumonia vaccine - check with Dr Jefm Bryant on appropriateness of getting this final vaccine when you are ready can schedule a nurse visit for this.  Ask Dr Nehemiah Massed about [place] or Praluent or the PCSK9 inhibitor injections instead of the statins that have not worked.  DUE for FASTING BLOOD WORK (no food or drink after midnight before the lab appointment, only water or coffee without cream/sugar on the morning of)  SCHEDULE "Lab Only" visit in the morning at the clinic for lab draw in 1 YEAR  - Make sure Lab Only appointment is at about 1 week before your next appointment, so that results will be available  For Lab Results, once available within 2-3 days of blood draw, you can can log in to MyChart online to view your results and a brief explanation. Also, we can discuss results at next follow-up visit.   Please schedule a Follow-up Appointment to: Return in about 1 year (around 10/04/2019) for Annual Physical.  If you have any other questions or concerns, please feel free to call the office or send a message through [facility]. You may also schedule an earlier appointment if necessary.  Additionally, you may be receiving a survey about your experience at our office within a few days to 1 week by e-mail or mail. We value your feedback.  Nobie Putnam, DO [facility]

## 2018-10-04 NOTE — Assessment & Plan Note (Signed)
Clinically with chronic RA, stable without current flare up Has persistent symptoms in hand joints affecting function if overuse Followed by Rheumatology Continues on MTX

## 2018-10-04 NOTE — Assessment & Plan Note (Signed)
Chronic problem elevated LDL lipids Last lipid 09/2018 - Long history of statin intolerance with myalgias (failed atorvastatin, rosuvastatin, simvastatin, and others) - Some family history of HLD, Heart Disease, CVA  Plan: 1. Recommend consider future PCSK9 inhibitor - may followup with cardiology to discuss further

## 2018-10-08 DIAGNOSIS — E782 Mixed hyperlipidemia: Secondary | ICD-10-CM | POA: Diagnosis not present

## 2018-10-08 DIAGNOSIS — I10 Essential (primary) hypertension: Secondary | ICD-10-CM | POA: Diagnosis not present

## 2018-10-08 DIAGNOSIS — I6523 Occlusion and stenosis of bilateral carotid arteries: Secondary | ICD-10-CM | POA: Diagnosis not present

## 2018-10-17 ENCOUNTER — Other Ambulatory Visit: Payer: Self-pay

## 2018-10-17 DIAGNOSIS — E782 Mixed hyperlipidemia: Secondary | ICD-10-CM

## 2018-10-17 NOTE — Progress Notes (Signed)
Dr. Nehemiah Massed patient. Ordered placed for Dr. Johnsie Cancel to read.

## 2018-10-24 DIAGNOSIS — Z79899 Other long term (current) drug therapy: Secondary | ICD-10-CM | POA: Diagnosis not present

## 2018-10-24 DIAGNOSIS — M0579 Rheumatoid arthritis with rheumatoid factor of multiple sites without organ or systems involvement: Secondary | ICD-10-CM | POA: Diagnosis not present

## 2018-10-30 DIAGNOSIS — M65321 Trigger finger, right index finger: Secondary | ICD-10-CM | POA: Diagnosis not present

## 2018-10-30 DIAGNOSIS — Z79899 Other long term (current) drug therapy: Secondary | ICD-10-CM | POA: Diagnosis not present

## 2018-10-30 DIAGNOSIS — M0579 Rheumatoid arthritis with rheumatoid factor of multiple sites without organ or systems involvement: Secondary | ICD-10-CM | POA: Diagnosis not present

## 2018-11-06 DIAGNOSIS — M0579 Rheumatoid arthritis with rheumatoid factor of multiple sites without organ or systems involvement: Secondary | ICD-10-CM | POA: Diagnosis not present

## 2018-12-24 ENCOUNTER — Inpatient Hospital Stay: Admission: RE | Admit: 2018-12-24 | Payer: PPO | Source: Ambulatory Visit

## 2019-02-12 ENCOUNTER — Other Ambulatory Visit: Payer: Self-pay | Admitting: Family Medicine

## 2019-02-12 DIAGNOSIS — M79642 Pain in left hand: Principal | ICD-10-CM

## 2019-02-12 DIAGNOSIS — M79641 Pain in right hand: Secondary | ICD-10-CM

## 2019-04-15 DIAGNOSIS — M0579 Rheumatoid arthritis with rheumatoid factor of multiple sites without organ or systems involvement: Secondary | ICD-10-CM | POA: Diagnosis not present

## 2019-04-15 DIAGNOSIS — Z79899 Other long term (current) drug therapy: Secondary | ICD-10-CM | POA: Diagnosis not present

## 2019-04-30 DIAGNOSIS — M0579 Rheumatoid arthritis with rheumatoid factor of multiple sites without organ or systems involvement: Secondary | ICD-10-CM | POA: Diagnosis not present

## 2019-07-10 DIAGNOSIS — M0579 Rheumatoid arthritis with rheumatoid factor of multiple sites without organ or systems involvement: Secondary | ICD-10-CM | POA: Diagnosis not present

## 2019-07-10 DIAGNOSIS — Z79899 Other long term (current) drug therapy: Secondary | ICD-10-CM | POA: Diagnosis not present

## 2019-07-23 ENCOUNTER — Other Ambulatory Visit: Payer: Self-pay | Admitting: Family Medicine

## 2019-07-23 ENCOUNTER — Ambulatory Visit (INDEPENDENT_AMBULATORY_CARE_PROVIDER_SITE_OTHER): Payer: PPO

## 2019-07-23 DIAGNOSIS — Z0000 Encounter for general adult medical examination without abnormal findings: Secondary | ICD-10-CM | POA: Diagnosis not present

## 2019-07-23 DIAGNOSIS — M62838 Other muscle spasm: Secondary | ICD-10-CM

## 2019-07-23 DIAGNOSIS — Z1239 Encounter for other screening for malignant neoplasm of breast: Secondary | ICD-10-CM | POA: Diagnosis not present

## 2019-07-23 MED ORDER — BACLOFEN 10 MG PO TABS: 5.0000 mg | ORAL_TABLET | Freq: Three times a day (TID) | ORAL | 2 refills | Status: DC | PRN

## 2019-07-23 NOTE — Patient Instructions (Signed)
Amber Parker , Thank you for taking time to come for your Medicare Wellness Visit. I appreciate your ongoing commitment to your health goals. Please review the following plan we discussed and let me know if I can assist you in the future.   Screening recommendations/referrals: Colonoscopy: completed 12/26/2016 Mammogram: Please call [phone] to schedule your mammogram.  Bone Density: completed 12/2006 Recommended yearly ophthalmology/optometry visit for glaucoma screening and checkup Recommended yearly dental visit for hygiene and checkup  Vaccinations: Influenza vaccine: due 08/2019 Pneumococcal vaccine: please get pneumovax 23 at next office visit  Tdap vaccine: up to date Shingles vaccine: shingrix eligible, check with your insurance company for coverage     Advanced directives: please let me know if you have any questions or need assistance with this paperwork   Conditions/risks identified: none  Next appointment: follow up in one year for your annual wellness visit.    Preventive Care 70 Years and Older, Female Preventive care refers to lifestyle choices and visits with your health care provider that can promote health and wellness. What does preventive care include?  A yearly physical exam. This is also called an annual well check.  Dental exams once or twice a year.  Routine eye exams. Ask your health care provider how often you should have your eyes checked.  Personal lifestyle choices, including:  Daily care of your teeth and gums.  Regular physical activity.  Eating a healthy diet.  Avoiding tobacco and drug use.  Limiting alcohol use.  Practicing safe sex.  Taking low-dose aspirin every day.  Taking vitamin and mineral supplements as recommended by your health care provider. What happens during an annual well check? The services and screenings done by your health care provider during your annual well check will depend on your age, overall health, lifestyle  risk factors, and family history of disease. Counseling  Your health care provider may ask you questions about your:  Alcohol use.  Tobacco use.  Drug use.  Emotional well-being.  Home and relationship well-being.  Sexual activity.  Eating habits.  History of falls.  Memory and ability to understand (cognition).  Work and work Statistician.  Reproductive health. Screening  You may have the following tests or measurements:  Height, weight, and BMI.  Blood pressure.  Lipid and cholesterol levels. These may be checked every 5 years, or more frequently if you are over 39 years old.  Skin check.  Lung cancer screening. You may have this screening every year starting at age 70 if you have a 30-pack-year history of smoking and currently smoke or have quit within the past 15 years.  Fecal occult blood test (FOBT) of the stool. You may have this test every year starting at age 49.  Flexible sigmoidoscopy or colonoscopy. You may have a sigmoidoscopy every 5 years or a colonoscopy every 10 years starting at age 23.  Hepatitis C blood test.  Hepatitis B blood test.  Sexually transmitted disease (STD) testing.  Diabetes screening. This is done by checking your blood sugar (glucose) after you have not eaten for a while (fasting). You may have this done every 1-3 years.  Bone density scan. This is done to screen for osteoporosis. You may have this done starting at age 67.  Mammogram. This may be done every 1-2 years. Talk to your health care provider about how often you should have regular mammograms. Talk with your health care provider about your test results, treatment options, and if necessary, the need for more tests. Vaccines  Your health care provider may recommend certain vaccines, such as:  Influenza vaccine. This is recommended every year.  Tetanus, diphtheria, and acellular pertussis (Tdap, Td) vaccine. You may need a Td booster every 10 years.  Zoster vaccine.  You may need this after age 24.  Pneumococcal 13-valent conjugate (PCV13) vaccine. One dose is recommended after age 26.  Pneumococcal polysaccharide (PPSV23) vaccine. One dose is recommended after age 69. Talk to your health care provider about which screenings and vaccines you need and how often you need them. This information is not intended to replace advice given to you by your health care provider. Make sure you discuss any questions you have with your health care provider. Document Released: 12/25/2015 Document Revised: 08/17/2016 Document Reviewed: 09/29/2015 Elsevier Interactive Patient Education  2017 [place] Prevention in the Home Falls can cause injuries. They can happen to people of all ages. There are many things you can do to make your home safe and to help prevent falls. What can I do on the outside of my home?  Regularly fix the edges of walkways and driveways and fix any cracks.  Remove anything that might make you trip as you walk through a door, such as a raised step or threshold.  Trim any bushes or trees on the path to your home.  Use bright outdoor lighting.  Clear any walking paths of anything that might make someone trip, such as rocks or tools.  Regularly check to see if handrails are loose or broken. Make sure that both sides of any steps have handrails.  Any raised decks and porches should have guardrails on the edges.  Have any leaves, snow, or ice cleared regularly.  Use sand or salt on walking paths during winter.  Clean up any spills in your garage right away. This includes oil or grease spills. What can I do in the bathroom?  Use night lights.  Install grab bars by the toilet and in the tub and shower. Do not use towel bars as grab bars.  Use non-skid mats or decals in the tub or shower.  If you need to sit down in the shower, use a plastic, non-slip stool.  Keep the floor dry. Clean up any water that spills on the floor as soon  as it happens.  Remove soap buildup in the tub or shower regularly.  Attach bath mats securely with double-sided non-slip rug tape.  Do not have throw rugs and other things on the floor that can make you trip. What can I do in the bedroom?  Use night lights.  Make sure that you have a light by your bed that is easy to reach.  Do not use any sheets or blankets that are too big for your bed. They should not hang down onto the floor.  Have a firm chair that has side arms. You can use this for support while you get dressed.  Do not have throw rugs and other things on the floor that can make you trip. What can I do in the kitchen?  Clean up any spills right away.  Avoid walking on wet floors.  Keep items that you use a lot in easy-to-reach places.  If you need to reach something above you, use a strong step stool that has a grab bar.  Keep electrical cords out of the way.  Do not use floor polish or wax that makes floors slippery. If you must use wax, use non-skid floor wax.  Do  not have throw rugs and other things on the floor that can make you trip. What can I do with my stairs?  Do not leave any items on the stairs.  Make sure that there are handrails on both sides of the stairs and use them. Fix handrails that are broken or loose. Make sure that handrails are as long as the stairways.  Check any carpeting to make sure that it is firmly attached to the stairs. Fix any carpet that is loose or worn.  Avoid having throw rugs at the top or bottom of the stairs. If you do have throw rugs, attach them to the floor with carpet tape.  Make sure that you have a light switch at the top of the stairs and the bottom of the stairs. If you do not have them, ask someone to add them for you. What else can I do to help prevent falls?  Wear shoes that:  Do not have high heels.  Have rubber bottoms.  Are comfortable and fit you well.  Are closed at the toe. Do not wear sandals.  If  you use a stepladder:  Make sure that it is fully opened. Do not climb a closed stepladder.  Make sure that both sides of the stepladder are locked into place.  Ask someone to hold it for you, if possible.  Clearly mark and make sure that you can see:  Any grab bars or handrails.  First and last steps.  Where the edge of each step is.  Use tools that help you move around (mobility aids) if they are needed. These include:  Canes.  Walkers.  Scooters.  Crutches.  Turn on the lights when you go into a dark area. Replace any light bulbs as soon as they burn out.  Set up your furniture so you have a clear path. Avoid moving your furniture around.  If any of your floors are uneven, fix them.  If there are any pets around you, be aware of where they are.  Review your medicines with your doctor. Some medicines can make you feel dizzy. This can increase your chance of falling. Ask your doctor what other things that you can do to help prevent falls. This information is not intended to replace advice given to you by your health care provider. Make sure you discuss any questions you have with your health care provider. Document Released: 09/24/2009 Document Revised: 05/05/2016 Document Reviewed: 01/02/2015 Elsevier Interactive Patient Education  2017 [employer].

## 2019-07-23 NOTE — Progress Notes (Signed)
Subjective:   Amber Parker is a 68 y.o. female who presents for Medicare Annual (Subsequent) preventive examination.  This visit is being conducted via phone call  - after an attmept to do on video chat - due to the COVID-19 pandemic. This patient has given me verbal consent via phone to conduct this visit, patient states they are participating from their home address. Some vital signs may be absent or patient reported.   Patient identification: identified by name, DOB, and current address.    Review of Systems:  Cardiac Risk Factors include: advanced age (>81men, >50 women)     Objective:     Vitals: There were no vitals taken for this visit.  There is no height or weight on file to calculate BMI.  Advanced Directives 07/23/2019 07/17/2018 12/26/2016  Does Patient Have a Medical Advance Directive? No No No  Would patient like information on creating a medical advance directive? - Yes (MAU/Ambulatory/Procedural Areas - Information given) No - Patient declined    Tobacco Social History   Tobacco Use  Smoking Status Never Smoker  Smokeless Tobacco Never Used     Counseling given: Not Answered   Clinical Intake:  Pre-visit preparation completed: Yes  Pain : No/denies pain     Nutritional Risks: None Diabetes: No  How often do you need to have someone help you when you read instructions, pamphlets, or other written materials from your doctor or pharmacy?: 1 - Never  Interpreter Needed?: No  Information entered by :: Tiffany Hill,LPN  Past Medical History:  Diagnosis Date  . Carotid artery stenosis    BILATERAL  . Hyperlipidemia    Past Surgical History:  Procedure Laterality Date  . CHOLECYSTECTOMY    . COLONOSCOPY WITH PROPOFOL N/A 12/26/2016   Procedure: COLONOSCOPY WITH PROPOFOL;  Surgeon: Manya Silvas, MD;  Location: [facility] ENDOSCOPY;  Service: Endoscopy;  Laterality: N/A;  . OOPHRECTOMY Bilateral    PARTIAL REMOVAL OF BOTH OVARIES  . TONSILLECTOMY     Family History  Problem Relation Age of Onset  . Breast cancer Paternal Aunt 14  . Hypertension Mother   . Hyperlipidemia Mother   . Stroke Mother   . Heart disease Mother   . Cancer Mother        lung cancer  . Diabetes Mother   . COPD Mother   . Heart attack Mother   . Neurologic Disorder Father   . Cancer Father        colon cancer   Social History   Socioeconomic History  . Marital status: Widowed    Spouse name: Not on file  . Number of children: Not on file  . Years of education: Not on file  . Highest education level: High school graduate  Occupational History  . Occupation: retired  Scientific laboratory technician  . Financial resource strain: Not hard at all  . Food insecurity    Worry: Never true    Inability: Never true  . Transportation needs    Medical: No    Non-medical: No  Tobacco Use  . Smoking status: Never Smoker  . Smokeless tobacco: Never Used  Substance and Sexual Activity  . Alcohol use: No  . Drug use: No  . Sexual activity: Not on file  Lifestyle  . Physical activity    Days per week: 0 days    Minutes per session: 0 min  . Stress: Not at all  Relationships  . Social Herbalist on phone: More  than three times a week    Gets together: More than three times a week    Attends religious service: More than 4 times per year    Active member of club or organization: No    Attends meetings of clubs or organizations: Never    Relationship status: Married  Other Topics Concern  . Not on file  Social History Narrative   Takes care of grandchildren during week     Outpatient Encounter Medications as of 07/23/2019  Medication Sig  . baclofen (LIORESAL) 10 MG tablet Take 0.5-1 tablets (5-10 mg total) by mouth 3 (three) times daily as needed for muscle spasms.  . diclofenac sodium (VOLTAREN) 1 % GEL APPLY 2 GRAMS 4 TIMES A DAY AS NEEDED, USE ON HANDS FOR ARTHRITIS UP TO 1-2 WEEKS PER FLARE  . folic acid (FOLVITE) 1 MG tablet Take by mouth.  .  methotrexate 50 MG/2ML injection Inject into the skin.  . naproxen sodium (ALEVE) 220 MG tablet Take 220 mg by mouth.  . sodium chloride (OCEAN) 0.65 % SOLN nasal spray Place 1 spray into both nostrils as needed for congestion.  . [DISCONTINUED] aspirin EC 81 MG tablet Take 81 mg by mouth daily.   No facility-administered encounter medications on file as of 07/23/2019.     Activities of Daily Living In your present state of health, do you have any difficulty performing the following activities: 07/23/2019  Hearing? N  Comment no hearing aids  Vision? N  Comment glasses  Difficulty concentrating or making decisions? N  Walking or climbing stairs? N  Dressing or bathing? N  Doing errands, shopping? N  Preparing Food and eating ? N  Using the Toilet? N  In the past six months, have you accidently leaked urine? N  Do you have problems with loss of bowel control? N  Managing your Medications? N  Managing your Finances? N  Housekeeping or managing your Housekeeping? N  Some recent data might be hidden    Patient Care Team: Olin Hauser, DO as PCP - General (Family Medicine) Manya Silvas, MD (Gastroenterology) Emmaline Kluver., MD (Rheumatology) Corey Skains, MD as Consulting Physician (Cardiology)    Assessment:   This is a routine wellness examination for [place].  Exercise Activities and Dietary recommendations Current Exercise Habits: The patient does not participate in regular exercise at present, Exercise limited by: None identified  Goals    . DIET - INCREASE WATER INTAKE     Recommend drinking at least 6-8 glasses of water a day        Fall Risk: Fall Risk  07/23/2019 10/03/2018 07/17/2018 02/01/2017  Falls in the past year? 0 No No No    FALL RISK PREVENTION PERTAINING TO THE HOME:  Any stairs in or around the home? yes If so, are there any without handrails? No   Home free of loose throw rugs in walkways, pet beds, electrical cords, etc?  Yes  Adequate lighting in your home to reduce risk of falls? Yes   ASSISTIVE DEVICES UTILIZED TO PREVENT FALLS:  Life alert? No  Use of a cane, walker or w/c? No  Grab bars in the bathroom? No  Shower chair or bench in shower? No  Elevated toilet seat or a handicapped toilet? No   DME ORDERS:  DME order needed?  No   TIMED UP AND GO:  Unable to perform   Depression Screen [facility] 2/9 Scores 07/23/2019 10/03/2018 07/17/2018 02/01/2017  PHQ - 2  Score 0 0 0 0     Cognitive Function     6CIT Screen 07/17/2018  What Year? 0 points  What month? 0 points  What time? 0 points  Count back from 20 0 points  Months in reverse 0 points  Repeat phrase 0 points  Total Score 0    Immunization History  Administered Date(s) Administered  . Influenza, High Dose Seasonal PF 02/01/2017  . Pneumococcal Conjugate-13 02/01/2017  . Tdap 01/13/2016    Qualifies for Shingles Vaccine? Yes  Zostavax completed n/a. Due for Shingrix. Education has been provided regarding the importance of this vaccine. Pt has been advised to call insurance company to determine out of pocket expense. Advised may also receive vaccine at local pharmacy or Health Dept. Verbalized acceptance and understanding.  Tdap: up to date   Flu Vaccine: Due 08/2019  Pneumococcal Vaccine: Due for Pneumococcal vaccine.Education has been provided regarding the importance of this vaccine but still declined. Advised may receive this vaccine at local pharmacy or Health Dept. Aware to provide a copy of the vaccination record if obtained from local pharmacy or Health Dept. Verbalized acceptance and understanding.   Screening Tests Health Maintenance  Topic Date Due  . PNA vac Low Risk Adult (2 of 2 - PPSV23) 02/01/2018  . INFLUENZA VACCINE  07/13/2019  . MAMMOGRAM  09/25/2020  . TETANUS/TDAP  01/12/2026  . COLONOSCOPY  12/26/2026  . DEXA SCAN  Completed  . Hepatitis C Screening  Completed    Cancer Screenings:  Colorectal  Screening: Completed 12/26/2016. Repeat every 10 years  Mammogram: Completed 09/25/2018. Repeat every year  Bone Density: Completed 12/12/2006.   Lung Cancer Screening: (Low Dose CT Chest recommended if Age 70-80 years, 30 pack-year currently smoking OR have quit w/in 15years.) does not qualify.    Additional Screening:  Hepatitis C Screening: does qualify; Completed 09/26/2018  Vision Screening: Recommended annual ophthalmology exams for early detection of glaucoma and other disorders of the eye. Is the patient up to date with their annual eye exam?  Yes  Who is the provider or what is the name of the office in which the pt attends annual eye exams? Dr.Bell   Dental Screening: Recommended annual dental exams for proper oral hygiene  Community Resource Referral:  CRR required this visit?  No       Plan:  I have personally reviewed and addressed the Medicare Annual Wellness questionnaire and have noted the following in the patient's chart:  A. Medical and social history B. Use of alcohol, tobacco or illicit drugs  C. Current medications and supplements D. Functional ability and status E.  Nutritional status F.  Physical activity G. Advance directives H. List of other physicians I.  Hospitalizations, surgeries, and ER visits in previous 12 months J.  [place] such as hearing and vision if needed, cognitive and depression L. Referrals and appointments   In addition, I have reviewed and discussed with patient certain preventive protocols, quality metrics, and best practice recommendations. A written personalized care plan for preventive services as well as general preventive health recommendations were provided to patient.  Signed,    Bevelyn Ngo, LPN  5/59/7416 Nurse Health Advisor   Nurse Notes: requests refill on baclofen. Patient was just taking as needed. Last prescribed 09/2018.

## 2019-10-23 DIAGNOSIS — M0579 Rheumatoid arthritis with rheumatoid factor of multiple sites without organ or systems involvement: Secondary | ICD-10-CM | POA: Diagnosis not present

## 2019-10-23 DIAGNOSIS — Z79899 Other long term (current) drug therapy: Secondary | ICD-10-CM | POA: Diagnosis not present

## 2019-10-29 DIAGNOSIS — M0579 Rheumatoid arthritis with rheumatoid factor of multiple sites without organ or systems involvement: Secondary | ICD-10-CM | POA: Diagnosis not present

## 2019-10-29 DIAGNOSIS — Z79899 Other long term (current) drug therapy: Secondary | ICD-10-CM | POA: Diagnosis not present

## 2019-12-09 ENCOUNTER — Ambulatory Visit
Admission: RE | Admit: 2019-12-09 | Discharge: 2019-12-09 | Disposition: A | Discharge status: Home / Self Care | Payer: PPO | Source: Ambulatory Visit | Attending: Family Medicine | Admitting: Family Medicine

## 2019-12-09 DIAGNOSIS — Z1239 Encounter for other screening for malignant neoplasm of breast: Secondary | ICD-10-CM

## 2019-12-09 DIAGNOSIS — Z1231 Encounter for screening mammogram for malignant neoplasm of breast: Secondary | ICD-10-CM | POA: Insufficient documentation

## 2020-01-15 DIAGNOSIS — H353131 Nonexudative age-related macular degeneration, bilateral, early dry stage: Secondary | ICD-10-CM | POA: Diagnosis not present

## 2020-01-28 DIAGNOSIS — Z79899 Other long term (current) drug therapy: Secondary | ICD-10-CM | POA: Diagnosis not present

## 2020-01-28 DIAGNOSIS — M0579 Rheumatoid arthritis with rheumatoid factor of multiple sites without organ or systems involvement: Secondary | ICD-10-CM | POA: Diagnosis not present

## 2020-02-03 DIAGNOSIS — H43813 Vitreous degeneration, bilateral: Secondary | ICD-10-CM | POA: Diagnosis not present

## 2020-05-05 DIAGNOSIS — M0579 Rheumatoid arthritis with rheumatoid factor of multiple sites without organ or systems involvement: Secondary | ICD-10-CM | POA: Diagnosis not present

## 2020-05-05 DIAGNOSIS — Z79899 Other long term (current) drug therapy: Secondary | ICD-10-CM | POA: Diagnosis not present

## 2020-05-12 DIAGNOSIS — M0579 Rheumatoid arthritis with rheumatoid factor of multiple sites without organ or systems involvement: Secondary | ICD-10-CM | POA: Diagnosis not present

## 2020-05-12 DIAGNOSIS — Z79899 Other long term (current) drug therapy: Secondary | ICD-10-CM | POA: Diagnosis not present

## 2020-07-13 ENCOUNTER — Telehealth (INDEPENDENT_AMBULATORY_CARE_PROVIDER_SITE_OTHER): Payer: PPO | Admitting: Family Medicine

## 2020-07-13 ENCOUNTER — Encounter: Payer: Self-pay | Admitting: Family Medicine

## 2020-07-13 ENCOUNTER — Other Ambulatory Visit: Payer: Self-pay

## 2020-07-13 DIAGNOSIS — J0110 Acute frontal sinusitis, unspecified: Secondary | ICD-10-CM

## 2020-07-13 MED ORDER — BENZONATATE 100 MG PO CAPS: 100.0000 mg | ORAL_CAPSULE | Freq: Three times a day (TID) | ORAL | 0 refills | Status: DC | PRN

## 2020-07-13 MED ORDER — FLUTICASONE PROPIONATE 50 MCG/ACT NA SUSP: 2.0000 | Freq: Every day | NASAL | 3 refills | Status: DC

## 2020-07-13 MED ORDER — AZITHROMYCIN 250 MG PO TABS: ORAL_TABLET | ORAL | 0 refills | Status: DC

## 2020-07-13 NOTE — Progress Notes (Signed)
Virtual Visit via Telephone The purpose of this virtual visit is to provide medical care while limiting exposure to the novel coronavirus (COVID19) for both patient and office staff.  Consent was obtained for phone visit:  Yes.   Answered questions that patient had about telehealth interaction:  Yes.   I discussed the limitations, risks, security and privacy concerns of performing an evaluation and management service by telephone. I also discussed with the patient that there may be a patient responsible charge related to this service. The patient expressed understanding and agreed to proceed.  Patient Location: Home Provider Location: [facility])   ---------------------------------------------------------------------- Chief Complaint  Patient presents with  . Cough    Symptoms started with sore throat then it improved. Productive cough, head congestion, scratchy throat x 2 weeks. Taking mucinex DM, Robitussin . No COVID vaccine.    S: Reviewed CMA documentation. I have called patient and gathered additional HPI as follows:  COUGH, Productive / Sinusitis Reports symptoms onset 2 weeks with initial productive cough head congestion, and then sore throat with drainage. Took mucinex and nasal saline and robitussin improved then worsening again by her report with persistent sinus drainage and then worse productive cough. - Not updated on COVID vaccine.  Denies any known or suspected exposure to person with or possibly with COVID19.  Denies any fevers, chills, sweats, body ache, loss of taste or smell, abdominal pain, diarrhea  Past Medical History:  Diagnosis Date  . Carotid artery stenosis    BILATERAL  . Hyperlipidemia      -------------------------------------------------------------------------- O: No physical exam performed due to remote telephone encounter.  -------------------------------------------------------------------------- A&P:  Suspected  Acute Bronchitis following Sinusitis, possible for benign viral etiology at onset - now concern with progression of symptoms, consider 2nd sickening and cannot rule out bacterial infection. - Reassuring without high risk symptoms - Afebrile, without dyspnea - No comorbid pulmonary conditions (asthma, COPD) She is on Methotrexate for RA  1. Start Azithromycin Z pak (antibiotic) 2 tabs day 1, then 1 tab x 4 days, complete entire course even if improved, could not use Augmentin due to interaction with MTX 2. Start Tessalon Perls take 1 capsule up to 3 times a day as needed for cough 3. Start nasal steroid Flonase 2 sprays in each nostril daily for 4-6 weeks, may repeat course seasonally or as needed 4. Stop mucinex, OTC supportive meds  No orders of the defined types were placed in this encounter.  OPTIONAL RECOMMENDED self quarantine for patient safety for PREVENTION ONLY. It is not required based on current clinical symptoms. If they were to develop fever or worsening shortness of breath, then emphasis on REQUIRED quarantine for up to 7-14 days that could be resolved if fever free >3 days AND if symptoms improving after 7 days.   If symptoms do not resolve or significantly improve OR if WORSENING - fever / cough - or worsening shortness of breath - then should contact us and seek advice on next steps in treatment at home vs where/when to seek care at Urgent Care or Hospital ED for further intervention and possible testing if indicated.  Patient verbalizes understanding with the above medical recommendations including the limitation of remote medical advice.  Specific follow-up / call-back criteria were given for patient to follow-up or seek medical care more urgently if needed.   - Time spent in direct consultation with patient on phone: 10 minutes   Nobie Putnam, [place]  [facility] 07/13/2020, 3:08 PM

## 2020-07-13 NOTE — Patient Instructions (Addendum)
°  1. Start Azithromycin Z pak (antibiotic) 2 tabs day 1, then 1 tab x 4 days, complete entire course even if improved, could not use Augmentin due to interaction with MTX 2. Start Tessalon Perls take 1 capsule up to 3 times a day as needed for cough 3. Start nasal steroid Flonase 2 sprays in each nostril daily for 4-6 weeks, may repeat course seasonally or as needed  If needed, recommend COVID testing  [place]:  [facility]) [place] [address]  Hours: Monday - Friday 2:00pm to 5:00pm  COVID-19 Testing By Appointment Only  Online scheduling can be done online at [email or web address] or by texting COVID to [phone].  Test result may take 2-7 days to result. You will be notified by MyChart or by Phone.  (Pre-Admission prior to surgery or procedures at hospital will still be done at [facility])  If negative test - they will call you with result. If abnormal or positive test you will be notified as well and our office will contact you to help further with treatment plan.  May take Tylenol as needed for aches pains and fever. Prefer to avoid Ibuprofen if can help it, to avoid complication from virus.  REQUIRED self quarantine to [place] - advised to avoid all exposure with others while during treatment. Should continue to quarantine for up to 7-14 days, pending resolution of symptoms, if symptoms resolve by 7 days and is afebrile >3 days - may STOP self quarantine at that time.  If symptoms do not resolve or significantly improve OR if WORSENING - fever / cough - or worsening shortness of breath - then should contact us and seek advice on next steps in treatment at home vs where/when to seek care at Urgent Care or Hospital ED for further intervention   Please schedule a Follow-up Appointment to: Return in about 1 week (around 07/20/2020), or if symptoms  worsen or fail to improve, for 1 week sinusitis.  If you have any other questions or concerns, please feel free to call the office or send a message through [place]. You may also schedule an earlier appointment if necessary.  Additionally, you may be receiving a survey about your experience at our office within a few days to 1 week by e-mail or mail. We value your feedback.  Nobie Putnam, DO [place]

## 2020-08-04 DIAGNOSIS — Z79899 Other long term (current) drug therapy: Secondary | ICD-10-CM | POA: Diagnosis not present

## 2020-08-04 DIAGNOSIS — M069 Rheumatoid arthritis, unspecified: Secondary | ICD-10-CM | POA: Diagnosis not present

## 2020-08-18 DIAGNOSIS — Z79899 Other long term (current) drug therapy: Secondary | ICD-10-CM | POA: Diagnosis not present

## 2020-08-18 DIAGNOSIS — M0579 Rheumatoid arthritis with rheumatoid factor of multiple sites without organ or systems involvement: Secondary | ICD-10-CM | POA: Diagnosis not present

## 2020-09-01 DIAGNOSIS — M0579 Rheumatoid arthritis with rheumatoid factor of multiple sites without organ or systems involvement: Secondary | ICD-10-CM | POA: Diagnosis not present

## 2020-09-08 ENCOUNTER — Telehealth: Payer: Self-pay | Admitting: Family Medicine

## 2020-09-08 NOTE — Telephone Encounter (Signed)
I left a message asking the patient to call and schedule virtual AWV with [place].

## 2021-01-27 ENCOUNTER — Other Ambulatory Visit: Payer: Self-pay | Admitting: Family Medicine

## 2021-02-16 DIAGNOSIS — H353131 Nonexudative age-related macular degeneration, bilateral, early dry stage: Secondary | ICD-10-CM | POA: Diagnosis not present

## 2021-02-17 DIAGNOSIS — Z79899 Other long term (current) drug therapy: Secondary | ICD-10-CM | POA: Diagnosis not present

## 2021-02-17 DIAGNOSIS — M0579 Rheumatoid arthritis with rheumatoid factor of multiple sites without organ or systems involvement: Secondary | ICD-10-CM | POA: Diagnosis not present

## 2021-02-22 ENCOUNTER — Telehealth: Payer: Self-pay | Admitting: Family Medicine

## 2021-02-22 NOTE — Telephone Encounter (Signed)
Copied from [place] [phone]. Topic: Medicare AWV >> Feb 22, 2021 10:40 AM Cher Nakai R wrote: Reason for CRM:   Left message for patient to call back and schedule the Medicare Annual Wellness Visit (AWV) virtually or by telephone.  Last AWV  07/23/2019  Please schedule at anytime with [place].  40 minute appointment  Any questions, please call me at [phone]

## 2021-02-23 DIAGNOSIS — K219 Gastro-esophageal reflux disease without esophagitis: Secondary | ICD-10-CM | POA: Diagnosis not present

## 2021-02-23 DIAGNOSIS — Z79899 Other long term (current) drug therapy: Secondary | ICD-10-CM | POA: Diagnosis not present

## 2021-02-23 DIAGNOSIS — M0579 Rheumatoid arthritis with rheumatoid factor of multiple sites without organ or systems involvement: Secondary | ICD-10-CM | POA: Diagnosis not present

## 2021-02-23 DIAGNOSIS — M79641 Pain in right hand: Secondary | ICD-10-CM | POA: Diagnosis not present

## 2021-03-08 ENCOUNTER — Ambulatory Visit: Payer: PPO | Attending: Rheumatology | Admitting: Occupational Therapy

## 2021-03-08 ENCOUNTER — Encounter: Payer: Self-pay | Admitting: Occupational Therapy

## 2021-03-08 ENCOUNTER — Other Ambulatory Visit: Payer: Self-pay

## 2021-03-08 DIAGNOSIS — M25641 Stiffness of right hand, not elsewhere classified: Secondary | ICD-10-CM | POA: Insufficient documentation

## 2021-03-08 DIAGNOSIS — M79641 Pain in right hand: Secondary | ICD-10-CM | POA: Diagnosis not present

## 2021-03-08 NOTE — Therapy (Signed)
[place] [facility] [address], [place], [address] Phone: [phone]   Fax:  [phone]  Occupational Therapy Evaluation  Patient Details  Name: Amber Parker MRN: [id number] Date of Birth: October 05, 1951 Referring Provider (OT): Dr Jefm Bryant   Encounter Date: 03/08/2021   OT End of Session - 03/08/21 2046    Visit Number 1    Number of Visits 4    Date for OT Re-Evaluation 04/05/21    OT Start Time 1300    OT Stop Time 1350    OT Time Calculation (min) 50 min    Activity Tolerance Patient tolerated treatment well    Behavior During Therapy [facility] for tasks assessed/performed           Past Medical History:  Diagnosis Date  . Carotid artery stenosis    BILATERAL  . Hyperlipidemia     Past Surgical History:  Procedure Laterality Date  . CHOLECYSTECTOMY    . COLONOSCOPY WITH PROPOFOL N/A 12/26/2016   Procedure: COLONOSCOPY WITH PROPOFOL;  Surgeon: Manya Silvas, MD;  Location: [facility] ENDOSCOPY;  Service: Endoscopy;  Laterality: N/A;  . OOPHRECTOMY Bilateral    PARTIAL REMOVAL OF BOTH OVARIES  . TONSILLECTOMY      There were no vitals filed for this visit.   Subjective Assessment - 03/08/21 2041    Subjective  My R hand - pinkie has been bothering me for about year or 2- does not want to bend -and some stiffness and tenderness    Pertinent History Seen by Dr Jefm Bryant 02/23/21 - Rheumatoid arthritis, more disease activity. Dysfunction of the right fifth finger with incomplete flexion. Trigger nodule versus tenosynovitis. Versus tendon tear    -positive rheumatoid factor.  Positive anti-CCP antibodies.    -Methotrexate   Reflux    Plan:  Discussed adding drugs. Discussed Humira. She declined. Does not want biologic she thinks she will be too sensitive. Willing to try Plaquenil.  See back 3 months. Labs prior Refer to  Hand therapy    Patient Stated Goals I want to be able to bend that pinkie and make fist - with no  pain    Currently in Pain? Yes    Pain Score 3     Pain Location Hand    Pain Orientation Right    Pain Descriptors / Indicators Aching;Tightness    Pain Type Chronic pain    Pain Onset More than a month ago    Pain Frequency Constant             [facility] OT Assessment - 03/08/21 0001      Assessment   Medical Diagnosis RA with R 5th digit stiffness    Referring Provider (OT) Dr Jefm Bryant    Onset Date/Surgical Date --   about 2 yrs   Hand Dominance Left      Home  Environment   Lives With Alone      Prior Function   Vocation Retired    Leisure help wiht grandkids(11,7,5), [employer] work , read, on phone ,puzzles      Strength   Right Hand Grip (lbs) 32    Right Hand Lateral Pinch 17 lbs    Right Hand 3 Point Pinch 10 lbs    Left Hand Grip (lbs) 55    Left Hand Lateral Pinch 17 lbs    Left Hand 3 Point Pinch 10 lbs      Right Hand AROM   R Index  MCP 0-90  80 Degrees    R Index PIP 0-100 100 Degrees    R Long  MCP 0-90 85 Degrees    R Long PIP 0-100 95 Degrees    R Ring  MCP 0-90 90 Degrees    R Ring PIP 0-100 100 Degrees    R Little  MCP 0-90 95 Degrees    R Little PIP 0-100 60 Degrees              contrast done -and then fabricated pt MC block splint to use of R 5th MC - during night time  And during day with functional tasks to block MC and allow more PIP flexion   Contrast during day 2-3 x day  Tendon glides - block - MC flexion and composite fist -but block MC at 90 with pen -and more leverage to PIP flexion   10 reps  NO INTRINSIC FIST Opposition - 5 reps  And joint protection ed on - hand out provided               OT Education - 03/08/21 2046    Education provided Yes    Education Details findings of eval and HEP    Person(s) Educated Patient    Methods Explanation;Demonstration;Tactile cues;Verbal cues;Handout    Comprehension Verbal cues required;Returned demonstration;Verbalized understanding               OT Long Term Goals -  03/08/21 2051      OT LONG TERM GOAL #1   Title Pt to be independent in HEP to increase R 5th digit flexion to touch palm without pain    Baseline PIP flexion of R 5th 60 , MC 95 - extenitoin WNL-  soreness 2-3/10    Time 4    Period Weeks    Status New    Target Date 04/05/21      OT LONG TERM GOAL #2   Title Pt to verbalize 3 joint protection and AE to increase use of hands and decrease pain in hands     Baseline no knowledge on joint protection or AE     Time 3    Period Weeks    Status New    Target Date 03/22/21                 Plan - 03/08/21 2047    Clinical Impression Statement Pt refer to OT with diagnosis of RA - with R hand stiffness - mostly 5th digit - pt show decrease PIP flexion , and composite flexion- has about 60 degrees of flexion at PIP , but during session wtih blocking MC at 90 - was able to faciliate 75 flexion at PIP - extention of 5th WNL - tenderness over A1pulley not really - soreness more than pain in R 5th digit - pt grip and prehension strenght is actually increase compare to 4 yrs ago - pt ed on HEP , MC block splint to use on R 5th digit with funcitona activities and night time -and  joint protection- pt can benefit from [place] and History Problem Focused Assessment - Including review of records relating to presenting problem    Occupational performance deficits (Please refer to evaluation for details): ADL's;IADL's;Play;Leisure    Body Structure / Function / Physical Skills ADL;ROM;UE functional use;Flexibility;FMC;Strength;Pain;IADL;Dexterity    Rehab Potential Good    Clinical Decision Making Limited treatment options, no task modification necessary    Comorbidities Affecting Occupational Performance: None  Modification or Assistance to Complete Evaluation  No modification of tasks or assist necessary to complete eval    OT Frequency 1x / week    OT Duration 4 weeks    OT Treatment/Interventions Self-care/ADL  training;Ultrasound;Paraffin;Contrast Bath;Fluidtherapy;DME and/or AE instruction;Manual Therapy;Passive range of motion;Therapeutic exercise;Splinting;Patient/family education    Consulted and Agree with Plan of Care Patient           Patient will benefit from skilled therapeutic intervention in order to improve the following deficits and impairments:   Body Structure / Function / Physical Skills: ADL,ROM,UE functional use,Flexibility,FMC,Strength,Pain,IADL,Dexterity       Visit Diagnosis: Stiffness of right hand, not elsewhere classified - Plan: Ot plan of care cert/re-cert  Pain in right hand - Plan: Ot plan of care cert/re-cert    Problem List Patient Active Problem List   Diagnosis Date Noted  . Rheumatoid arthritis ([place]) 10/03/2018  . Bilateral hand swelling 05/10/2017  . Bilateral hand pain 05/10/2017  . Hyperlipidemia 02/01/2017  . Bilateral chronic knee pain 02/01/2017  . Allergic rhinosinusitis 02/01/2017  . Bilateral carotid artery stenosis 05/03/2016    Rosalyn Gess OTR/L,CLT 03/08/2021, 8:54 PM  [place] [facility] [address], [place], [address] Phone: [phone]   Fax:  [phone]  Name: Amber Parker MRN: [id number] Date of Birth: 06-27-1951

## 2021-03-15 ENCOUNTER — Other Ambulatory Visit: Payer: Self-pay

## 2021-03-15 ENCOUNTER — Ambulatory Visit: Payer: PPO | Attending: Rheumatology | Admitting: Occupational Therapy

## 2021-03-15 DIAGNOSIS — M25641 Stiffness of right hand, not elsewhere classified: Secondary | ICD-10-CM | POA: Insufficient documentation

## 2021-03-15 DIAGNOSIS — M79641 Pain in right hand: Secondary | ICD-10-CM | POA: Diagnosis not present

## 2021-03-15 NOTE — Therapy (Signed)
[facility] [address] [place], [address] Phone: [phone]   Fax:  [phone]  Occupational Therapy Treatment  Patient Details  Name: Amber Parker MRN: [id number] Date of Birth: Feb 23, 1951 Referring Provider (OT): Dr Jefm Bryant   Encounter Date: 03/15/2021   OT End of Session - 03/15/21 1452    Visit Number 2    Number of Visits 4    Date for OT Re-Evaluation 04/05/21    OT Start Time 1430    OT Stop Time 1511    OT Time Calculation (min) 41 min    Activity Tolerance Patient tolerated treatment well    Behavior During Therapy [facility] for tasks assessed/performed           Past Medical History:  Diagnosis Date  . Carotid artery stenosis    BILATERAL  . Hyperlipidemia     Past Surgical History:  Procedure Laterality Date  . CHOLECYSTECTOMY    . COLONOSCOPY WITH PROPOFOL N/A 12/26/2016   Procedure: COLONOSCOPY WITH PROPOFOL;  Surgeon: Manya Silvas, MD;  Location: [facility] ENDOSCOPY;  Service: Endoscopy;  Laterality: N/A;  . OOPHRECTOMY Bilateral    PARTIAL REMOVAL OF BOTH OVARIES  . TONSILLECTOMY      There were no vitals filed for this visit.   Subjective Assessment - 03/15/21 1449    Subjective  I brought my results from xrays - DIP's , ulnar styloid and CMC's - but I think my pinkie bending more    Pertinent History Seen by Dr Jefm Bryant 02/23/21 - Rheumatoid arthritis, more disease activity. Dysfunction of the right fifth finger with incomplete flexion. Trigger nodule versus tenosynovitis. Versus tendon tear    -positive rheumatoid factor.  Positive anti-CCP antibodies.    -Methotrexate   Reflux    Plan:  Discussed adding drugs. Discussed Humira. She declined. Does not want biologic she thinks she will be too sensitive. Willing to try Plaquenil.  See back 3 months. Labs prior Refer to  Hand therapy    Patient Stated Goals I want to be able to bend that pinkie and make fist - with no pain     Currently in Pain? No/denies              [facility] OT Assessment - 03/15/21 0001      Right Hand AROM   R Little  MCP 0-90 95 Degrees    R Little PIP 0-100 70 Degrees   75 blocked MC                   OT Treatments/Exercises (OP) - 03/15/21 0001      RUE Paraffin   Number Minutes Paraffin 8 Minutes    RUE Paraffin Location Hand    Comments intrinsic stretch with heatingpad prior t osoft tissue            Pt to cont with  MC block splint to use of R 5th MC - during night time    Pt to cont with Contrast during day 2-3 x day  Tendon glides - block - MC flexion PROM to DIP and PIP flexion  Add and fabricate DIP/PIP strap for flexion - wear 2 min  Prior to composite fist -but block MC at 90 with pen -and more leverage to PIP flexion place and hold   10 reps  NO INTRINSIC FIST Opposition - 5 reps  And joint protection ed on - hand out provided   Fitted with buddy strap  to use during day with functional activities        OT Education - 03/15/21 1452    Education provided Yes    Education Details progress and changes to HEP. buddy strap use    Person(s) Educated Patient    Methods Explanation;Demonstration;Tactile cues;Verbal cues;Handout    Comprehension Verbal cues required;Returned demonstration;Verbalized understanding               OT Long Term Goals - 03/08/21 2051      OT LONG TERM GOAL #1   Title Pt to be independent in HEP to increase R 5th digit flexion to touch palm without pain    Baseline PIP flexion of R 5th 60 , MC 95 - extenitoin WNL-  soreness 2-3/10    Time 4    Period Weeks    Status New    Target Date 04/05/21      OT LONG TERM GOAL #2   Title Pt to verbalize 3 joint protection and AE to increase use of hands and decrease pain in hands     Baseline no knowledge on joint protection or AE     Time 3    Period Weeks    Status New    Target Date 03/22/21                 Plan - 03/15/21 1453    Clinical Impression  Statement Pt refer to OT with diagnosis of RA - with R hand stiffness - mostly 5th digit - pt showincrease of 10 degrees at R 5th PIP - and when MC block 15 degrees increase of flexion at PIP - pt to do strain with flexors if not doing PROM , stretch into flexion -and increase edema over volar 5th MC - add this date buddystrap to wear during day instead of MC block splint - cont with MC block splint night time  - soreness more than pain in R 5th digit - pt grip and prehension strenght is actually increase compare to 4 yrs ago - pt ed on HEP , MC block splint/buddy strap to use on R 5th digit with funcitonal activities and night time -and  joint protection- pt can benefit from [place]    OT Occupational Profile and History Problem Focused Assessment - Including review of records relating to presenting problem    Occupational performance deficits (Please refer to evaluation for details): ADL's;IADL's;Play;Leisure    Body Structure / Function / Physical Skills ADL;ROM;UE functional use;Flexibility;FMC;Strength;Pain;IADL;Dexterity    Rehab Potential Good    Clinical Decision Making Limited treatment options, no task modification necessary    Comorbidities Affecting Occupational Performance: None    Modification or Assistance to Complete Evaluation  No modification of tasks or assist necessary to complete eval    OT Frequency 1x / week    OT Duration 4 weeks    OT Treatment/Interventions Self-care/ADL training;Ultrasound;Paraffin;Contrast Bath;Fluidtherapy;DME and/or AE instruction;Manual Therapy;Passive range of motion;Therapeutic exercise;Splinting;Patient/family education    Plan assess progress with homeprogram    OT Home Exercise Plan see pt instruction    Consulted and Agree with Plan of Care Patient           Patient will benefit from skilled therapeutic intervention in order to improve the following deficits and impairments:   Body Structure / Function / Physical Skills: ADL,ROM,UE  functional use,Flexibility,FMC,Strength,Pain,IADL,Dexterity       Visit Diagnosis: Stiffness of right hand, not elsewhere classified  Pain in right hand    Problem List Patient  Active Problem List   Diagnosis Date Noted  . Rheumatoid arthritis ([place]) 10/03/2018  . Bilateral hand swelling 05/10/2017  . Bilateral hand pain 05/10/2017  . Hyperlipidemia 02/01/2017  . Bilateral chronic knee pain 02/01/2017  . Allergic rhinosinusitis 02/01/2017  . Bilateral carotid artery stenosis 05/03/2016    Rosalyn Gess OTR/L,CLT 03/15/2021, 8:10 PM  [facility] [address], [place], [address] Phone: [phone]   Fax:  [phone]  Name: Amber Parker MRN: [id number] Date of Birth: March 01, 1951

## 2021-03-23 ENCOUNTER — Ambulatory Visit: Payer: PPO | Admitting: Occupational Therapy

## 2021-03-23 ENCOUNTER — Other Ambulatory Visit: Payer: Self-pay

## 2021-03-23 DIAGNOSIS — M25641 Stiffness of right hand, not elsewhere classified: Secondary | ICD-10-CM | POA: Diagnosis not present

## 2021-03-23 DIAGNOSIS — M79641 Pain in right hand: Secondary | ICD-10-CM

## 2021-03-23 NOTE — Therapy (Signed)
[facility] [address], [place], [address] Phone: [phone]   Fax:  [phone]  Occupational Therapy Treatment  Patient Details  Name: Amber Parker MRN: [id number] Date of Birth: [birth year] Referring Provider (OT): Dr Jefm Bryant   Encounter Date: 03/23/2021   OT End of Session - 03/23/21 1315    Visit Number 3    Number of Visits 4    Date for OT Re-Evaluation 04/05/21    OT Start Time 1517    OT Stop Time 1355    OT Time Calculation (min) 40 min    Activity Tolerance Patient tolerated treatment well    Behavior During Therapy [facility] for tasks assessed/performed           Past Medical History:  Diagnosis Date  . Carotid artery stenosis    BILATERAL  . Hyperlipidemia     Past Surgical History:  Procedure Laterality Date  . CHOLECYSTECTOMY    . COLONOSCOPY WITH PROPOFOL N/A 12/26/2016   Procedure: COLONOSCOPY WITH PROPOFOL;  Surgeon: Manya Silvas, MD;  Location: [facility] ENDOSCOPY;  Service: Endoscopy;  Laterality: N/A;  . OOPHRECTOMY Bilateral    PARTIAL REMOVAL OF BOTH OVARIES  . TONSILLECTOMY      There were no vitals filed for this visit.   Subjective Assessment - 03/23/21 1315    Subjective  Done the buddy strap but sliding down several times durign day -and then middle finger wants to lock about 2 x a week in the morning - need to show how to put the white strap on    Pertinent History Seen by Dr Jefm Bryant 02/23/21 - Rheumatoid arthritis, more disease activity. Dysfunction of the right fifth finger with incomplete flexion. Trigger nodule versus tenosynovitis. Versus tendon tear    -positive rheumatoid factor.  Positive anti-CCP antibodies.    -Methotrexate   Reflux    Plan:  Discussed adding drugs. Discussed Humira. She declined. Does not want biologic she thinks she will be too sensitive. Willing to try Plaquenil.  See back 3 months. Labs prior Refer to  Hand therapy    Patient Stated Goals I  want to be able to bend that pinkie and make fist - with no pain    Currently in Pain? No/denies              [facility] OT Assessment - 03/23/21 0001      Right Hand AROM   R Little  MCP 0-90 95 Degrees    R Little PIP 0-100 75 Degrees   80 after few AROM                   OT Treatments/Exercises (OP) - 03/23/21 0001      RUE Contrast Bath   Time 8 minutes    Comments prior to soft tissue  adn ROM            Pt to cont with  MC block splint to use of R 5th MC - during night time    Pt to cont with Contrast during day 2-3 x day   Lat time fabricate DIP/PIP strap for flexion - wear 2 min x 3 during HEP - 3  Day- Prior to composite fist PROM 3 x 2 min pain free Then Tendon glides - block - MC flexion Place and hold composite fist - with  MC at 90 block with pen -and more leverage to PIP flexion place and hold  10 reps  NO INTRINSIC FIST Opposition - 5 reps  And joint protection ed on - hand out providedand REINFORCE again - and lighten up on grip   Fitted with buddy strap to use during day with functional activities - easy 80 PIP flexion in session and during buddy strap use          OT Education - 03/23/21 1344    Education provided Yes    Education Details progress and changes to HEP. buddy strap use    Person(s) Educated Patient    Methods Explanation;Demonstration;Tactile cues;Verbal cues;Handout               OT Long Term Goals - 03/08/21 2051      OT LONG TERM GOAL #1   Title Pt to be independent in HEP to increase R 5th digit flexion to touch palm without pain    Baseline PIP flexion of R 5th 60 , MC 95 - extenitoin WNL-  soreness 2-3/10    Time 4    Period Weeks    Status New    Target Date 04/05/21      OT LONG TERM GOAL #2   Title Pt to verbalize 3 joint protection and AE to increase use of hands and decrease pain in hands     Baseline no knowledge on joint protection or AE     Time 3    Period Weeks    Status New    Target  Date 03/22/21                 Plan - 03/23/21 1344    Clinical Impression Statement Pt refer to OT with diagnosis of RA - with R hand stiffness - mostly 5th digit - pt show increase of 10-20 degrees at R 5th PIP - - pt to do strain with flexors if not doing PROM , stretch into flexion  - pt to cont with DIP/PIP strap and then composite flexion stretch to 5th into palm - prior to attempts of place and hold , and AROM into flexion with MC block at 90 - cont with  buddystrap to wear during day to maintian motion in 5th and alignment  - cont with MC block splint night time  - soreness more than pain in R 5th digit  - pt ed on HEP , MC block splint/buddy strap/DIP/PIP strap to use on R 5th digit with funcitonal activities and night time -and  joint protection- pt can benefit from [place] and History Problem Focused Assessment - Including review of records relating to presenting problem    Occupational performance deficits (Please refer to evaluation for details): ADL's;IADL's;Play;Leisure    Body Structure / Function / Physical Skills ADL;ROM;UE functional use;Flexibility;FMC;Strength;Pain;IADL;Dexterity    Rehab Potential Good    Clinical Decision Making Limited treatment options, no task modification necessary    Comorbidities Affecting Occupational Performance: None    Modification or Assistance to Complete Evaluation  No modification of tasks or assist necessary to complete eval    OT Frequency Biweekly    OT Duration 4 weeks    OT Treatment/Interventions Self-care/ADL training;Ultrasound;Paraffin;Contrast Bath;Fluidtherapy;DME and/or AE instruction;Manual Therapy;Passive range of motion;Therapeutic exercise;Splinting;Patient/family education    Plan assess progress with homeprogram    OT Home Exercise Plan see pt instruction    Consulted and Agree with Plan of Care Patient           Patient will benefit from skilled therapeutic intervention in order to  improve  the following deficits and impairments:   Body Structure / Function / Physical Skills: ADL,ROM,UE functional use,Flexibility,FMC,Strength,Pain,IADL,Dexterity       Visit Diagnosis: Stiffness of right hand, not elsewhere classified  Pain in right hand    Problem List Patient Active Problem List   Diagnosis Date Noted  . Rheumatoid arthritis ([place]) 10/03/2018  . Bilateral hand swelling 05/10/2017  . Bilateral hand pain 05/10/2017  . Hyperlipidemia 02/01/2017  . Bilateral chronic knee pain 02/01/2017  . Allergic rhinosinusitis 02/01/2017  . Bilateral carotid artery stenosis 05/03/2016    Rosalyn Gess  OTR/L,CLT 03/23/2021, 2:02 PM  [facility] [address], [place], [address] Phone: [phone]   Fax:  [phone]  Name: KEYMANI GLYNN MRN: [id number] Date of Birth: 1951/10/30

## 2021-04-06 ENCOUNTER — Other Ambulatory Visit: Payer: Self-pay

## 2021-04-06 ENCOUNTER — Ambulatory Visit: Payer: PPO | Admitting: Occupational Therapy

## 2021-04-06 DIAGNOSIS — M25641 Stiffness of right hand, not elsewhere classified: Secondary | ICD-10-CM | POA: Diagnosis not present

## 2021-04-06 DIAGNOSIS — M79641 Pain in right hand: Secondary | ICD-10-CM

## 2021-04-06 NOTE — Therapy (Signed)
[place] [facility] [address] [place], [address] Phone: [phone]   Fax:  [phone]  Occupational Therapy Treatment  Patient Details  Name: Amber Parker MRN: [id number] Date of Birth: 1951-01-21 Referring Provider (OT): Dr Jefm Bryant   Encounter Date: 04/06/2021   OT End of Session - 04/06/21 1352    Visit Number 4    Number of Visits 6    Date for OT Re-Evaluation 06/29/21    OT Start Time 1326    OT Stop Time 1350    OT Time Calculation (min) 24 min    Activity Tolerance Patient tolerated treatment well    Behavior During Therapy [facility] for tasks assessed/performed           Past Medical History:  Diagnosis Date  . Carotid artery stenosis    BILATERAL  . Hyperlipidemia     Past Surgical History:  Procedure Laterality Date  . CHOLECYSTECTOMY    . COLONOSCOPY WITH PROPOFOL N/A 12/26/2016   Procedure: COLONOSCOPY WITH PROPOFOL;  Surgeon: Manya Silvas, MD;  Location: [facility] ENDOSCOPY;  Service: Endoscopy;  Laterality: N/A;  . OOPHRECTOMY Bilateral    PARTIAL REMOVAL OF BOTH OVARIES  . TONSILLECTOMY      There were no vitals filed for this visit.   Subjective Assessment - 04/06/21 1351    Subjective  My finger do not feel as tight or stiff- think it's little better- do not stick out so far anymore    Pertinent History Seen by Dr Jefm Bryant 02/23/21 - Rheumatoid arthritis, more disease activity. Dysfunction of the right fifth finger with incomplete flexion. Trigger nodule versus tenosynovitis. Versus tendon tear    -positive rheumatoid factor.  Positive anti-CCP antibodies.    -Methotrexate   Reflux    Plan:  Discussed adding drugs. Discussed Humira. She declined. Does not want biologic she thinks she will be too sensitive. Willing to try Plaquenil.  See back 3 months. Labs prior Refer to  Hand therapy    Patient Stated Goals I want to be able to bend that pinkie and make fist - with no pain    Currently  in Pain? No/denies              [facility] OT Assessment - 04/06/21 0001      Strength   Right Hand Grip (lbs) 41    Left Hand Grip (lbs) 55      Right Hand AROM   R Little  MCP 0-90 90 Degrees    R Little PIP 0-100 75 Degrees   80           Pt coming in after 2 wks with same AROM in 5th PIP on R hand - but increase grip by 9 lbs since eval      Pt to cont withMC block splint to use of R 5th MC - during night time    Pt to cont withContrast during day 2-3 x day if increase edema- otherwise heat  cont DIP/PIP strap for flexion - wear 2 min x 3 during HEP - 3  Day- Prior tocomposite fist PROM 3 x 2 min pain free Then Tendon glides - block - MC flexion Place and hold composite fist - with  MC at 90 block with pen -and more leverage to PIP flexionplace and hold 10 reps  NO INTRINSIC FIST Opposition - 5 reps  And joint protection ed on - hand out providedand REINFORCE again - and lighten up  on grip   Fitted with buddy strap to use during day with functional activities- easy 80 PIP flexion in session and during buddy strap              OT Education - 04/06/21 1351    Education Details progress and changes to HEP    Person(s) Educated Patient    Methods Explanation;Demonstration;Tactile cues;Verbal cues;Handout    Comprehension Verbal cues required;Returned demonstration;Verbalized understanding               OT Long Term Goals - 04/06/21 1355      OT LONG TERM GOAL #1   Title Pt to be independent in HEP to increase R 5th digit flexion to touch palm without pain    Baseline PIP flexion of R 5th 60 , MC 95 - extenitoin WNL-  soreness 2-3/10     NOW - flexion of PIP 75-80 but not touching palm yet - and pain better- only soreness at times    Time 4    Period Weeks    Status On-going    Target Date 05/04/21      OT LONG TERM GOAL #2   Title Pt to verbalize 3 joint protection and AE to increase use of hands and decrease pain in hands      Baseline enlarge grips and use palms or larger joints    Status Achieved      OT LONG TERM GOAL #3   Title Grip strength and prehension in bilateral hands increase to [facility] and age range for pt  to report decrease symptoms and increase use     Baseline Grip increase from 32 to 41 lbs on R hand - L hand in 50"s    Status Achieved                 Plan - 04/06/21 1353    Clinical Impression Statement Pt refer to OT with diagnosis of RA - with R hand stiffness - mostly 5th digit - pt show increase of 15-20 degrees at R 5th PIP - - pt to do strain with flexors if not doing PROM , stretch into flexion  - pt to cont with DIP/PIP strap and then composite flexion stretch to 5th into palm - prior to attempts of place and hold , and AROM into flexion with MC block at 90 - cont with  buddystrap to wear during day to maintian motion in 5th  and alignment  - cont with MC block splint night time  - soreness more than pain in R 5th digit  - pt to cont with HEP fo rmonth - showed increase grip strength since 4 wks ago - increase from 32 to 41 lbs in R hand -  cont with  joint protection- pt can benefit from [place] and History Problem Focused Assessment - Including review of records relating to presenting problem    Occupational performance deficits (Please refer to evaluation for details): ADL's;IADL's;Play;Leisure    Body Structure / Function / Physical Skills ADL;ROM;UE functional use;Flexibility;FMC;Strength;Pain;IADL;Dexterity    Rehab Potential Good    Clinical Decision Making Limited treatment options, no task modification necessary    Comorbidities Affecting Occupational Performance: None    Modification or Assistance to Complete Evaluation  No modification of tasks or assist necessary to complete eval    OT Frequency Monthly   and then 8 wks   OT Duration 12 weeks    OT Treatment/Interventions Self-care/ADL training;Ultrasound;Paraffin;Contrast Bath;Fluidtherapy;DME  and/or AE instruction;Manual Therapy;Passive range of motion;Therapeutic exercise;Splinting;Patient/family education    Plan assess progress with homeprogram    OT Home Exercise Plan see pt instruction    Consulted and Agree with Plan of Care Patient           Patient will benefit from skilled therapeutic intervention in order to improve the following deficits and impairments:   Body Structure / Function / Physical Skills: ADL,ROM,UE functional use,Flexibility,FMC,Strength,Pain,IADL,Dexterity       Visit Diagnosis: Stiffness of right hand, not elsewhere classified - Plan: Ot plan of care cert/re-cert  Pain in right hand - Plan: Ot plan of care cert/re-cert    Problem List Patient Active Problem List   Diagnosis Date Noted  . Rheumatoid arthritis ([place]) 10/03/2018  . Bilateral hand swelling 05/10/2017  . Bilateral hand pain 05/10/2017  . Hyperlipidemia 02/01/2017  . Bilateral chronic knee pain 02/01/2017  . Allergic rhinosinusitis 02/01/2017  . Bilateral carotid artery stenosis 05/03/2016    Rosalyn Gess OTR/L,CLT 04/06/2021, 1:59 PM  [place] [facility] [address], [place], [address] Phone: [phone]   Fax:  [phone]  Name: Amber Parker MRN: [id number] Date of Birth: 07-21-1951

## 2021-05-04 ENCOUNTER — Ambulatory Visit: Payer: PPO | Attending: Rheumatology | Admitting: Occupational Therapy

## 2021-05-04 ENCOUNTER — Other Ambulatory Visit: Payer: Self-pay

## 2021-05-04 DIAGNOSIS — M25641 Stiffness of right hand, not elsewhere classified: Secondary | ICD-10-CM | POA: Diagnosis not present

## 2021-05-04 DIAGNOSIS — M79641 Pain in right hand: Secondary | ICD-10-CM | POA: Diagnosis not present

## 2021-05-04 NOTE — Therapy (Signed)
[facility] [address], [place], [address] Phone: [phone]   Fax:  [phone]  Occupational Therapy Treatment  Patient Details  Name: Amber Parker MRN: [id number] Date of Birth: 1951/08/27 Referring Provider (OT): Dr Jefm Bryant   Encounter Date: 05/04/2021   OT End of Session - 05/04/21 1346    Visit Number 5    Number of Visits 6    Date for OT Re-Evaluation 06/29/21    OT Start Time 1318    OT Stop Time 1344    OT Time Calculation (min) 26 min    Activity Tolerance Patient tolerated treatment well    Behavior During Therapy [facility] for tasks assessed/performed           Past Medical History:  Diagnosis Date  . Carotid artery stenosis    BILATERAL  . Hyperlipidemia     Past Surgical History:  Procedure Laterality Date  . CHOLECYSTECTOMY    . COLONOSCOPY WITH PROPOFOL N/A 12/26/2016   Procedure: COLONOSCOPY WITH PROPOFOL;  Surgeon: Manya Silvas, MD;  Location: [facility] ENDOSCOPY;  Service: Endoscopy;  Laterality: N/A;  . OOPHRECTOMY Bilateral    PARTIAL REMOVAL OF BOTH OVARIES  . TONSILLECTOMY      There were no vitals filed for this visit.   Subjective Assessment - 05/04/21 1318    Subjective  I know my finger is better than it was - but about the same than last time I seen you last month - if I work it to much - it gets swollen and painfull -I am trying to get appt with Dr Jefm Bryant 3rd week of June    Pertinent History Seen by Dr Jefm Bryant 02/23/21 - Rheumatoid arthritis, more disease activity. Dysfunction of the right fifth finger with incomplete flexion. Trigger nodule versus tenosynovitis. Versus tendon tear    -positive rheumatoid factor.  Positive anti-CCP antibodies.    -Methotrexate   Reflux    Plan:  Discussed adding drugs. Discussed Humira. She declined. Does not want biologic she thinks she will be too sensitive. Willing to try Plaquenil.  See back 3 months. Labs prior Refer to  Hand  therapy    Patient Stated Goals I want to be able to bend that pinkie and make fist - with no pain    Currently in Pain? No/denies              [facility] OT Assessment - 05/04/21 0001      Strength   Right Hand Grip (lbs) 45    Right Hand Lateral Pinch 17 lbs    Right Hand 3 Point Pinch 10 lbs    Left Hand Grip (lbs) 55    Left Hand Lateral Pinch 17 lbs    Left Hand 3 Point Pinch 10 lbs      Right Hand AROM   R Little  MCP 0-90 90 Degrees    R Little PIP 0-100 75 Degrees   80 if MC block at 90             Pt coming in after month with same AROM in 5th PIP on R hand -  Since eval increase from 60 - 75/80 degrees  And grip increase again - and from [facility] increase 32 lbs to 45 lbs     Pt to cont [place] block splint to use of R 5th MC - during night time    Pt to cont withContrast during day 2-3 x dayif increase edema-  otherwise heat  cont DIP/PIP strap for flexion - wear 2 minx 3 during HEP - 3 Day- Prior tocomposite fistPROM 3 x 2 min pain free ThenTendon glides - block - MC flexion Place and hold composite fist - withMC at 90 blockwith pen -and more leverage to PIP flexionplace and hold 10 reps  NO INTRINSIC FIST Opposition - 5 reps  And joint protection ed on - hand out providedand REINFORCE again - and lighten up on grip  Pt plan to make 3 month follow up appt with Dr Jefm Bryant and ? Referral to ortho- ? Diagnostic US for [facility] and PIP - mechanism - cracking and popping at times with attempts of PIP or intrinsic fist                OT Education - 05/04/21 1319    Education provided Yes    Education Details progress and changes to HEP    Person(s) Educated Patient    Methods Explanation;Demonstration;Tactile cues;Verbal cues;Handout    Comprehension Verbal cues required;Returned demonstration;Verbalized understanding               OT Long Term Goals - 05/04/21 1354      OT LONG TERM GOAL #1   Title Pt to be independent in  HEP to increase R 5th digit flexion to touch palm without pain    Baseline PIP flexion of R 5th 60 , MC 95 - extenitoin WNL-  soreness 2-3/10     NOW - flexion of PIP 75-80 but not touching palm yet - and pain better- only soreness at times but cannot do more than 80 degrees PIP flexion    Time 4    Period Weeks    Status On-going    Target Date 06/03/21      OT LONG TERM GOAL #2   Title Pt to verbalize 3 joint protection and AE to increase use of hands and decrease pain in hands     Status Achieved      OT LONG TERM GOAL #3   Title Grip strength and prehension in bilateral hands increase to [facility] and age range for pt  to report decrease symptoms and increase use     Baseline Grip increase from 32 to 45 lbs on R hand - L hand in 50"s    Status Achieved                 Plan - 05/04/21 1346    Clinical Impression Statement Pt refer to OT with diagnosis of RA - with R hand stiffness - mostly 5th digit - pt show increase of 15-20 degrees at R 5th PIP - pt do strain with flexors if not doing PROM , - pt to cont with DIP/PIP strap and then composite flexion stretch to 5th into palm - prior to attempts of place and hold , and AROM into flexion with MC block at 90 - cont with  buddystrap to wear during day to maintian motion in 5th  and alignment  - cont with MC block splint night time  - soreness more than pain in R 5th digit but recommend to use Voltaren as needed  - pt to cont with HEP until next appt with Dr Jefm Bryant - pt PIP flexion increase from 60 -80 , DIP to 55  but cannot maintain intrinsic fist or do AROM  - showed increase grip strength from 32lbs to 45 lbs in 5 visiits  -pt to see Dr Raliegh Ip and discuss if  need ortho consult / ? [place] diagnostic    OT Occupational Profile and History Problem Focused Assessment - Including review of records relating to presenting problem    Occupational performance deficits (Please refer to evaluation for details): ADL's;IADL's;Play;Leisure    Body Structure /  Function / Physical Skills ADL;ROM;UE functional use;Flexibility;FMC;Strength;Pain;IADL;Dexterity    Rehab Potential Good    Clinical Decision Making Limited treatment options, no task modification necessary    Comorbidities Affecting Occupational Performance: None    Modification or Assistance to Complete Evaluation  No modification of tasks or assist necessary to complete eval    OT Frequency Monthly    OT Duration 12 weeks    OT Treatment/Interventions Self-care/ADL training;Ultrasound;Paraffin;Contrast Bath;Fluidtherapy;DME and/or AE instruction;Manual Therapy;Passive range of motion;Therapeutic exercise;Splinting;Patient/family education    Plan assess progress with homeprogram    OT Home Exercise Plan see pt instruction    Consulted and Agree with Plan of Care Patient           Patient will benefit from skilled therapeutic intervention in order to improve the following deficits and impairments:   Body Structure / Function / Physical Skills: ADL,ROM,UE functional use,Flexibility,FMC,Strength,Pain,IADL,Dexterity       Visit Diagnosis: Stiffness of right hand, not elsewhere classified  Pain in right hand    Problem List Patient Active Problem List   Diagnosis Date Noted  . Rheumatoid arthritis ([place]) 10/03/2018  . Bilateral hand swelling 05/10/2017  . Bilateral hand pain 05/10/2017  . Hyperlipidemia 02/01/2017  . Bilateral chronic knee pain 02/01/2017  . Allergic rhinosinusitis 02/01/2017  . Bilateral carotid artery stenosis 05/03/2016    Rosalyn Gess OTR/l,CLT 05/04/2021, 1:56 PM  [facility] [address] [place], [address] Phone: [phone]   Fax:  [phone]  Name: Amber Parker MRN: [id number] Date of Birth: Apr 30, 1951

## 2021-05-25 DIAGNOSIS — Z79899 Other long term (current) drug therapy: Secondary | ICD-10-CM | POA: Diagnosis not present

## 2021-05-25 DIAGNOSIS — M0579 Rheumatoid arthritis with rheumatoid factor of multiple sites without organ or systems involvement: Secondary | ICD-10-CM | POA: Diagnosis not present

## 2021-06-01 DIAGNOSIS — M0579 Rheumatoid arthritis with rheumatoid factor of multiple sites without organ or systems involvement: Secondary | ICD-10-CM | POA: Diagnosis not present

## 2021-06-01 DIAGNOSIS — M65351 Trigger finger, right little finger: Secondary | ICD-10-CM | POA: Diagnosis not present

## 2021-06-01 DIAGNOSIS — M79641 Pain in right hand: Secondary | ICD-10-CM | POA: Diagnosis not present

## 2021-06-01 DIAGNOSIS — Z79899 Other long term (current) drug therapy: Secondary | ICD-10-CM | POA: Diagnosis not present

## 2021-06-07 DIAGNOSIS — H903 Sensorineural hearing loss, bilateral: Secondary | ICD-10-CM | POA: Diagnosis not present

## 2021-06-15 DIAGNOSIS — R29898 Other symptoms and signs involving the musculoskeletal system: Secondary | ICD-10-CM | POA: Diagnosis not present

## 2021-06-15 DIAGNOSIS — M25641 Stiffness of right hand, not elsewhere classified: Secondary | ICD-10-CM | POA: Diagnosis not present

## 2021-06-15 DIAGNOSIS — M7989 Other specified soft tissue disorders: Secondary | ICD-10-CM | POA: Diagnosis not present

## 2021-06-17 ENCOUNTER — Other Ambulatory Visit: Payer: Self-pay | Admitting: Sports Medicine

## 2021-06-17 ENCOUNTER — Other Ambulatory Visit (HOSPITAL_COMMUNITY): Payer: Self-pay | Admitting: Sports Medicine

## 2021-06-17 DIAGNOSIS — R29898 Other symptoms and signs involving the musculoskeletal system: Secondary | ICD-10-CM

## 2021-06-17 DIAGNOSIS — M25641 Stiffness of right hand, not elsewhere classified: Secondary | ICD-10-CM

## 2021-06-17 DIAGNOSIS — M7989 Other specified soft tissue disorders: Secondary | ICD-10-CM

## 2021-07-16 DIAGNOSIS — H903 Sensorineural hearing loss, bilateral: Secondary | ICD-10-CM | POA: Diagnosis not present

## 2021-08-31 ENCOUNTER — Other Ambulatory Visit: Payer: Self-pay | Admitting: Family Medicine

## 2021-08-31 DIAGNOSIS — J0110 Acute frontal sinusitis, unspecified: Secondary | ICD-10-CM

## 2021-08-31 DIAGNOSIS — Z79899 Other long term (current) drug therapy: Secondary | ICD-10-CM | POA: Diagnosis not present

## 2021-08-31 DIAGNOSIS — M069 Rheumatoid arthritis, unspecified: Secondary | ICD-10-CM | POA: Diagnosis not present

## 2021-08-31 DIAGNOSIS — M059 Rheumatoid arthritis with rheumatoid factor, unspecified: Secondary | ICD-10-CM | POA: Diagnosis not present

## 2021-08-31 NOTE — Telephone Encounter (Signed)
Requested medication (s) are due for refill today: expired medication  Requested medication (s) are on the active medication list: yes  Last refill:  07/13/20 #16g 3 refills  Future visit scheduled: no, appt scheduling in progress  Notes to clinic:  expired medication. Do you want to renew Rx?      Requested Prescriptions  Pending Prescriptions Disp Refills   fluticasone (FLONASE) 50 MCG/ACT nasal spray [Pharmacy Med Name: FLUTICASONE PROPIONATE 50 MCG/ACT N] 16 g 3    Sig: PLACE 2 SPRAY INTO BOTH NOSTRILS ONCE DAILY FOR 4-6 WEEKS THEN STOP AND USE SEASONALLY OR AS NEEDED     Ear, Nose, and Throat: Nasal Preparations - Corticosteroids Failed - 08/31/2021 10:05 AM      Failed - Valid encounter within last 12 months    Recent Outpatient Visits           1 year ago Acute non-recurrent frontal sinusitis   [place], DO   2 years ago Annual physical exam   [place], DO   4 years ago Bilateral hand swelling   [place], Devonne Doughty, DO   4 years ago Encounter to establish care with new doctor   [facility] [place], Devonne Doughty, DO       Future Appointments             In 1 week [place], Devonne Doughty, [facility], [facility]

## 2021-09-13 ENCOUNTER — Ambulatory Visit: Payer: PPO | Admitting: Family Medicine

## 2021-09-21 ENCOUNTER — Encounter: Payer: Self-pay | Admitting: Family Medicine

## 2021-09-21 ENCOUNTER — Other Ambulatory Visit: Payer: Self-pay | Admitting: Family Medicine

## 2021-09-21 ENCOUNTER — Other Ambulatory Visit: Payer: Self-pay

## 2021-09-21 ENCOUNTER — Ambulatory Visit (INDEPENDENT_AMBULATORY_CARE_PROVIDER_SITE_OTHER): Payer: PPO | Admitting: Family Medicine

## 2021-09-21 VITALS — BP 110/57 | HR 85 | Ht 64.5 in | Wt 138.4 lb

## 2021-09-21 DIAGNOSIS — Z8619 Personal history of other infectious and parasitic diseases: Secondary | ICD-10-CM

## 2021-09-21 DIAGNOSIS — K219 Gastro-esophageal reflux disease without esophagitis: Secondary | ICD-10-CM | POA: Diagnosis not present

## 2021-09-21 DIAGNOSIS — E782 Mixed hyperlipidemia: Secondary | ICD-10-CM

## 2021-09-21 DIAGNOSIS — R7309 Other abnormal glucose: Secondary | ICD-10-CM

## 2021-09-21 DIAGNOSIS — R1013 Epigastric pain: Secondary | ICD-10-CM | POA: Diagnosis not present

## 2021-09-21 DIAGNOSIS — Z79899 Other long term (current) drug therapy: Secondary | ICD-10-CM

## 2021-09-21 DIAGNOSIS — Z0000 Encounter for general adult medical examination without abnormal findings: Secondary | ICD-10-CM

## 2021-09-21 DIAGNOSIS — M0579 Rheumatoid arthritis with rheumatoid factor of multiple sites without organ or systems involvement: Secondary | ICD-10-CM

## 2021-09-21 MED ORDER — SUCRALFATE 1 GM/10ML PO SUSP: 1.0000 g | Freq: Three times a day (TID) | ORAL | 1 refills | Status: DC

## 2021-09-21 MED ORDER — OMEPRAZOLE 40 MG PO CPDR: 40.0000 mg | DELAYED_RELEASE_CAPSULE | Freq: Every day | ORAL | 1 refills | Status: DC

## 2021-09-21 NOTE — Progress Notes (Signed)
Subjective:    Patient ID: Amber Parker, female    DOB: [birth year], 70 y.o.   MRN: [id number]  Amber Parker is a 69 y.o. female presenting on 09/21/2021 for Annual Exam  Change annual physical to problem visit today, did not have labs done prior to visit. She is re-establishing care, last visit 09/2018  HPI   GERD History of H Pylori Reports significant intestinal distress recently. She has tried Omeprazole, Rolaids, TUMs, Mylanta, eating bland She has not had relief recently. She describes heartburn and reflux with acid coming up in throat and feels epigastric pain with symptoms of a "steel bar" in stomach. Also dx with small hiatal hernia. Describes symptoms worse after eating immediately not long after. She feels stomach distention and bloating. No recent history with GI She was initially treated by her Rheumatologist was on PPI therapy initially, then seem worse has come off med. Questions if MTX is impacting her with side effects. Taking Aleve daily.    Depression screen [facility] 2/9 09/21/2021 07/23/2019 10/03/2018  Decreased Interest 0 0 0  Down, Depressed, Hopeless 0 0 0  PHQ - 2 Score 0 0 0  Altered sleeping 0 - -  Tired, decreased energy 1 - -  Change in appetite 0 - -  Feeling bad or failure about yourself  0 - -  Trouble concentrating 0 - -  Moving slowly or fidgety/restless 0 - -  Suicidal thoughts 0 - -  PHQ-9 Score 1 - -  Difficult doing work/chores Not difficult at all - -    Past Medical History:  Diagnosis Date   Carotid artery stenosis    BILATERAL   Hyperlipidemia    Past Surgical History:  Procedure Laterality Date   CHOLECYSTECTOMY     COLONOSCOPY WITH PROPOFOL N/A 12/26/2016   Procedure: COLONOSCOPY WITH PROPOFOL;  Surgeon: Manya Silvas, MD;  Location: [facility] ENDOSCOPY;  Service: Endoscopy;  Laterality: N/A;   OOPHRECTOMY Bilateral    PARTIAL REMOVAL OF BOTH OVARIES   TONSILLECTOMY     Social History   Socioeconomic History   Marital status:  Widowed    Spouse name: Not on file   Number of children: Not on file   Years of education: Not on file   Highest education level: High school graduate  Occupational History   Occupation: retired  Tobacco Use   Smoking status: Never   Smokeless tobacco: Never  Vaping Use   Vaping Use: Never used  Substance and Sexual Activity   Alcohol use: No   Drug use: No   Sexual activity: Not on file  Other Topics Concern   Not on file  Social History Narrative   Takes care of grandchildren during week    Social Determinants of Health   Financial Resource Strain: Not on file  Food Insecurity: Not on file  Transportation Needs: Not on file  Physical Activity: Not on file  Stress: Not on file  Social Connections: Not on file  Intimate Partner Violence: Not on file   Family History  Problem Relation Age of Onset   Breast cancer Paternal Aunt 49   Hypertension Mother    Hyperlipidemia Mother    Stroke Mother    Heart disease Mother    Cancer Mother        lung cancer   Diabetes Mother    COPD Mother    Heart attack Mother    Neurologic Disorder Father    Cancer Father  colon cancer   Current Outpatient Medications on File Prior to Visit  Medication Sig   baclofen (LIORESAL) 10 MG tablet Take 0.5-1 tablets (5-10 mg total) by mouth 3 (three) times daily as needed for muscle spasms.   diclofenac sodium (VOLTAREN) 1 % GEL APPLY 2 GRAMS 4 TIMES A DAY AS NEEDED, USE ON HANDS FOR ARTHRITIS UP TO 1-2 WEEKS PER FLARE   diclofenac Sodium (VOLTAREN) 1 % GEL Apply topically.   fluticasone (FLONASE) 50 MCG/ACT nasal spray PLACE 2 SPRAY INTO BOTH NOSTRILS ONCE DAILY FOR 4-6 WEEKS THEN STOP AND USE SEASONALLY OR AS NEEDED   folic acid (FOLVITE) 1 MG tablet Take 1 tablet by mouth daily.   methotrexate (RHEUMATREX) 2.5 MG tablet TAKE 6 TABLETS BY MOUTH SAME DAY WEEKLY WITH FOOD   naproxen sodium (ALEVE) 220 MG tablet Take 220 mg by mouth.   sodium chloride (OCEAN) 0.65 % SOLN nasal spray  Place 1 spray into both nostrils as needed for congestion.   No current facility-administered medications on file prior to visit.    Review of Systems Per HPI unless specifically indicated above      Objective:    BP (!) 110/57   Pulse 85   Ht 5' 4.5" (1.638 m)   Wt 138 lb 6.4 oz (62.8 kg)   SpO2 99%   BMI 23.39 kg/m   Wt Readings from Last 3 Encounters:  09/21/21 138 lb 6.4 oz (62.8 kg)  10/03/18 129 lb (58.5 kg)  07/17/18 130 lb (59 kg)    Physical Exam Vitals and nursing note reviewed.  Constitutional:      General: She is not in acute distress.    Appearance: Normal appearance. She is well-developed. She is not diaphoretic.     Comments: Well-appearing, comfortable, cooperative  HENT:     Head: Normocephalic and atraumatic.  Eyes:     General:        Right eye: No discharge.        Left eye: No discharge.     Conjunctiva/sclera: Conjunctivae normal.  Cardiovascular:     Rate and Rhythm: Normal rate.  Pulmonary:     Effort: Pulmonary effort is normal.  Skin:    General: Skin is warm and dry.     Findings: No erythema or rash.  Neurological:     Mental Status: She is alert and oriented to person, place, and time.  Psychiatric:        Mood and Affect: Mood normal.        Behavior: Behavior normal.        Thought Content: Thought content normal.     Comments: Well groomed, good eye contact, normal speech and thoughts   Results for orders placed or performed in visit on 09/26/18  Lipid panel  Result Value Ref Range   Cholesterol 294 (H) <200 mg/dL   HDL 64 >50 mg/dL   Triglycerides 150 (H) <150 mg/dL   LDL Cholesterol (Calc) 199 (H) mg/dL (calc)   Total CHOL/HDL Ratio 4.6 <5.0 (calc)   Non-HDL Cholesterol (Calc) 230 (H) <130 mg/dL (calc)  Hepatitis C antibody  Result Value Ref Range   Hepatitis C Ab NON-REACTIVE NON-REACTI   SIGNAL TO CUT-OFF 0.03 <1.00  COMPLETE METABOLIC PANEL WITH GFR  Result Value Ref Range   Glucose, Bld 83 65 - 99 mg/dL   BUN  16 7 - 25 mg/dL   Creat 0.66 0.50 - 0.99 mg/dL   GFR, Est Non African American 90 > OR =  60 mL/min/1.33m2   GFR, Est African American 104 > OR = 60 mL/min/1.23m2   BUN/Creatinine Ratio NOT APPLICABLE 6 - 22 (calc)   Sodium 139 135 - 146 mmol/L   Potassium 4.3 3.5 - 5.3 mmol/L   Chloride 100 98 - 110 mmol/L   CO2 31 20 - 32 mmol/L   Calcium 10.1 8.6 - 10.4 mg/dL   Total Protein 7.5 6.1 - 8.1 g/dL   Albumin 4.6 3.6 - 5.1 g/dL   Globulin 2.9 1.9 - 3.7 g/dL (calc)   AG Ratio 1.6 1.0 - 2.5 (calc)   Total Bilirubin 0.5 0.2 - 1.2 mg/dL   Alkaline phosphatase (APISO) 69 33 - 130 U/L   AST 26 10 - 35 U/L   ALT 25 6 - 29 U/L  CBC with Differential/Platelet  Result Value Ref Range   WBC 5.4 3.8 - 10.8 Thousand/uL   RBC 4.10 3.80 - 5.10 Million/uL   Hemoglobin 12.3 11.7 - 15.5 g/dL   HCT 36.6 35.0 - 45.0 %   MCV 89.3 80.0 - 100.0 fL   MCH 30.0 27.0 - 33.0 pg   MCHC 33.6 32.0 - 36.0 g/dL   RDW 13.6 11.0 - 15.0 %   Platelets 340 140 - 400 Thousand/uL   MPV 9.2 7.5 - 12.5 fL   Neutro Abs 3,218 1,500 - 7,800 cells/uL   Lymphs Abs 1,501 850 - 3,900 cells/uL   WBC mixed population 518 200 - 950 cells/uL   Eosinophils Absolute 130 15 - 500 cells/uL   Basophils Absolute 32 0 - 200 cells/uL   Neutrophils Relative % 59.6 %   Total Lymphocyte 27.8 %   Monocytes Relative 9.6 %   Eosinophils Relative 2.4 %   Basophils Relative 0.6 %  Hemoglobin A1c  Result Value Ref Range   Hgb A1c MFr Bld 5.2 <5.7 % of total Hgb   Mean Plasma Glucose 103 (calc)   eAG (mmol/L) 5.7 (calc)      Assessment & Plan:   Problem List Items Addressed This Visit     Gastroesophageal reflux disease without esophagitis - Primary   Relevant Medications   omeprazole (PRILOSEC) 40 MG capsule   sucralfate (CARAFATE) 1 GM/10ML suspension   Other Relevant Orders   H. pylori breath test   Other Visit Diagnoses     History of Helicobacter pylori infection       Relevant Orders   H. pylori breath test   Epigastric  abdominal pain           Suspect gastritis vs gastric ulcer likely secondary to MTX / NSAID No other significant GI red flags (no unintentional wt loss, night-sweats, refractory abdominal pain n/v, hematemesis or melena).  Hx H Pylori  Plan: 1. Start prolonged PPI trial with Omeprazole 40mg  daily for 8 weeks 2. Today check H pylori breath test (given in office), confirmed off PPI >2 weeks now - if positive, will increase PPI to BID dosing, add on triple therapy antibiotics with amoxicillin (1g BID) and clarithromycin (500mg  BID) for 2 weeks 3. Start carafate 1g TID WC + QHS PRN for symptom relief 4. Strict return criteria given for worsening symptoms 5. Follow-up within 1-2 weeks, if no improvement, will refer to GI for further evaluation, may need future EGD given chronicity of problem. If gradual improvement can f/u in office within 4 weeks    Orders Placed This Encounter  Procedures   H. pylori breath test    Meds ordered this encounter  Medications  omeprazole (PRILOSEC) 40 MG capsule    Sig: Take 1 capsule (40 mg total) by mouth daily before breakfast.    Dispense:  90 capsule    Refill:  1   sucralfate (CARAFATE) 1 GM/10ML suspension    Sig: Take 10 mLs (1 g total) by mouth 4 (four) times daily -  with meals and at bedtime. As needed for abdominal pain    Dispense:  420 mL    Refill:  1      Follow up plan: Return in about 4 weeks (around 10/19/2021) for 4 weeks fasting lab at [facility] then 1 week later Annual Physical (f/u GERD).  Nobie Putnam, DO [facility] 09/21/2021, 2:20 PM

## 2021-09-21 NOTE — Patient Instructions (Addendum)
Thank you for coming to the office today.  I am not sure the exact cause of your abdominal pain, however I am concerned that one significant possibility could be uncontrolled Acid Reflux (GERD) and may have developed an Ulcer (Peptic Ulcer of stomach).  After you have completed your H Pylori Breath Test today, we can start with the prescribed medicines, and we will notify you of your result and if we have to change treatment.  Before starting the prescribed treatment you need to complete the H Pylori Breath Test,   FASTING for 4 hours before the test, NO FOOD OR DRINK, only small amount of water is fine, can take other medicines).  IF BREATH TEST = NEGATIVE (or waiting for results):  Starting tomorrow before breakfast Omeprazole 40mg  daily. Prefer to take this med about 30 min before breakfast or 1st meal of day for 4 weeks, don't stop taking unless we discuss first. Probably will need for about 8 weeks total if this ends up being an Ulcer.   Take other prescribed medicine Carafate (Sucralfate) as needed up to 4 times daily (3 meals and bedtime)  to coat stomach lining to ease symptoms, if it helps reduce symptoms then it is more likely to be due to acid and/or ulcer.  IF BREATH TEST = POSITIVE (we notified you of this result): - CHANGE Protonix dose from 40mg  daily to 40mg  (one pill) TWICE daily, about 30 min before breakfast and dinner - FOR TWO WEEKS, then resume DAILY treatment - Also we will send in TWO antibiotics to take IN ADDITION: - Amoxicillin 1g TWICE daily / Clarithromycin 500mg  TWICE daily - both for 2 weeks   DIET RECOMMENDATIONS - Avoid spicy, greasy, fried foods, also things like caffeine, dark chocolate, peppermint can worsen - Avoid large meals and late night snacks, also do not go more than 4-5 hours without a snack or meal (not eating will worsen reflux symptoms due to stomach acid) - You may also elevate the head of your bed at night to sleep at very slight incline to  help reduce symptoms   If the problem improves but keeps coming back, we can discuss higher dose or longer course at next visit.   If symptoms are worsening or persistent despite treatment or develop any different severe esophagus or abdominal pain, unable to swallow solids or liquids, nausea, vomiting especially blood in vomit, fever/chills, or unintentional weight loss / no appetite, please follow-up sooner in office or seek more immediate medical attention at hospital Emergency Department.  Regarding other medicines:   - STOP taking Ibuprofen, Advil, Motrin, Goody's / BC powder - DO NOT take without discussing with your doctor. These medicines can put you at high risk for future bleeding.  If need pain medicine, may take Tylenol Extra Strength (Acetaminophen) 500mg  tabs - take 1 to 2 tabs per dose (max 1000mg ) every 6-8 hours for pain (take regularly, don't skip a dose for next 7 days), max 24 hour daily dose is 6 tablets or 3000mg . In the future you can repeat the same everyday Tylenol course for 1-2 weeks at a time.    DUE for FASTING BLOOD WORK (no food or drink after midnight before the lab appointment, only water or coffee without cream/sugar on the morning of)  SCHEDULE "Lab Only" visit in the morning at the clinic for lab draw in 4-6 WEEKS   - Make sure Lab Only appointment is at about 1 week before your next appointment, so that results will  be available  For Lab Results, once available within 2-3 days of blood draw, you can can log in to MyChart online to view your results and a brief explanation. Also, we can discuss results at next follow-up visit.   Please schedule a Follow-up Appointment to: Return in about 4 weeks (around 10/19/2021) for 4 weeks fasting lab only then 1 week later Annual Physical (f/u GERD).  If you have any other questions or concerns, please feel free to call the office or send a message through [place]. You may also schedule an earlier appointment if  necessary.  Additionally, you may be receiving a survey about your experience at our office within a few days to 1 week by e-mail or mail. We value your feedback.  Nobie Putnam, DO [place]

## 2021-09-22 ENCOUNTER — Telehealth: Payer: Self-pay

## 2021-09-22 ENCOUNTER — Telehealth: Payer: Self-pay | Admitting: Family Medicine

## 2021-09-22 DIAGNOSIS — K219 Gastro-esophageal reflux disease without esophagitis: Secondary | ICD-10-CM

## 2021-09-22 LAB — H. PYLORI BREATH TEST: H pylori Breath Test: NEGATIVE

## 2021-09-22 MED ORDER — SUCRALFATE 1 GM/10ML PO SUSP: 1.0000 g | Freq: Three times a day (TID) | ORAL | 1 refills | Status: DC

## 2021-09-22 MED ORDER — SUCRALFATE 1 G PO TABS: 1.0000 g | ORAL_TABLET | Freq: Three times a day (TID) | ORAL | 2 refills | Status: DC

## 2021-09-22 NOTE — Telephone Encounter (Signed)
Copied from [place] [id number]. Topic: General - Other >> Sep 22, 2021  1:10 PM Leward Quan A wrote: Reason for CRM: [facility] called in to inform Dr Raliegh Ip that patient would like to have tablets instead of the suspension of sucralfate (CARAFATE) 1 GM/10ML suspension asking for a new Rx to be sent please

## 2021-09-22 NOTE — Telephone Encounter (Signed)
Sent new rx Sucralfate tablets  Nobie Putnam, DO [facility] 09/22/2021, 4:13 PM '

## 2021-09-22 NOTE — Telephone Encounter (Signed)
Pt states the sucralfate (CARAFATE) 1 GM/10ML suspension has a $100 copay.  Pt needs to change this to the tablet. However [facility] has it on backorder, so she has found it at  [place], [place]  Can you send new Rx to them?

## 2021-10-26 DIAGNOSIS — E782 Mixed hyperlipidemia: Secondary | ICD-10-CM | POA: Diagnosis not present

## 2021-10-26 DIAGNOSIS — Z79899 Other long term (current) drug therapy: Secondary | ICD-10-CM | POA: Diagnosis not present

## 2021-10-26 DIAGNOSIS — M0579 Rheumatoid arthritis with rheumatoid factor of multiple sites without organ or systems involvement: Secondary | ICD-10-CM | POA: Diagnosis not present

## 2021-10-26 DIAGNOSIS — R7309 Other abnormal glucose: Secondary | ICD-10-CM | POA: Diagnosis not present

## 2021-10-26 DIAGNOSIS — Z0000 Encounter for general adult medical examination without abnormal findings: Secondary | ICD-10-CM | POA: Diagnosis not present

## 2021-10-27 LAB — COMPREHENSIVE METABOLIC PANEL
ALT: 31 IU/L (ref 0–32)
AST: 32 IU/L (ref 0–40)
Albumin/Globulin Ratio: 2.2 (ref 1.2–2.2)
Albumin: 5.1 g/dL — ABNORMAL HIGH (ref 3.7–4.7)
Alkaline Phosphatase: 81 IU/L (ref 44–121)
BUN/Creatinine Ratio: 17 (ref 12–28)
BUN: 11 mg/dL (ref 8–27)
Bilirubin Total: 0.4 mg/dL (ref 0.0–1.2)
CO2: 27 mmol/L (ref 20–29)
Calcium: 10.5 mg/dL — ABNORMAL HIGH (ref 8.7–10.3)
Chloride: 102 mmol/L (ref 96–106)
Creatinine, Ser: 0.64 mg/dL (ref 0.57–1.00)
Globulin, Total: 2.3 g/dL (ref 1.5–4.5)
Glucose: 82 mg/dL (ref 70–99)
Potassium: 4.4 mmol/L (ref 3.5–5.2)
Sodium: 141 mmol/L (ref 134–144)
Total Protein: 7.4 g/dL (ref 6.0–8.5)
eGFR: 94 mL/min/{1.73_m2} (ref >59)

## 2021-10-27 LAB — LIPID PANEL
Chol/HDL Ratio: 5.5 ratio — ABNORMAL HIGH (ref 0.0–4.4)
Cholesterol, Total: 327 mg/dL — ABNORMAL HIGH (ref 100–199)
HDL: 59 mg/dL (ref >39)
LDL Chol Calc (NIH): 233 mg/dL — ABNORMAL HIGH (ref 0–99)
Triglycerides: 180 mg/dL — ABNORMAL HIGH (ref 0–149)
VLDL Cholesterol Cal: 35 mg/dL (ref 5–40)

## 2021-10-27 LAB — HEMOGLOBIN A1C
Est. average glucose Bld gHb Est-mCnc: 103 mg/dL
Hgb A1c MFr Bld: 5.2 % (ref 4.8–5.6)

## 2021-10-27 LAB — TSH: TSH: 2.89 u[IU]/mL (ref 0.450–4.500)

## 2021-12-07 DIAGNOSIS — Z79899 Other long term (current) drug therapy: Secondary | ICD-10-CM | POA: Diagnosis not present

## 2021-12-07 DIAGNOSIS — M0579 Rheumatoid arthritis with rheumatoid factor of multiple sites without organ or systems involvement: Secondary | ICD-10-CM | POA: Diagnosis not present

## 2021-12-14 ENCOUNTER — Encounter: Payer: Self-pay | Admitting: Family Medicine

## 2021-12-14 ENCOUNTER — Other Ambulatory Visit: Payer: Self-pay

## 2021-12-14 ENCOUNTER — Ambulatory Visit (INDEPENDENT_AMBULATORY_CARE_PROVIDER_SITE_OTHER): Payer: PPO | Admitting: Family Medicine

## 2021-12-14 VITALS — BP 115/51 | HR 85 | Ht 64.5 in | Wt 140.2 lb

## 2021-12-14 DIAGNOSIS — D649 Anemia, unspecified: Secondary | ICD-10-CM | POA: Diagnosis not present

## 2021-12-14 DIAGNOSIS — M0579 Rheumatoid arthritis with rheumatoid factor of multiple sites without organ or systems involvement: Secondary | ICD-10-CM | POA: Diagnosis not present

## 2021-12-14 DIAGNOSIS — Z1231 Encounter for screening mammogram for malignant neoplasm of breast: Secondary | ICD-10-CM

## 2021-12-14 DIAGNOSIS — E7801 Familial hypercholesterolemia: Secondary | ICD-10-CM

## 2021-12-14 DIAGNOSIS — E78019 Familial hypercholesterolemia, unspecified: Secondary | ICD-10-CM

## 2021-12-14 DIAGNOSIS — M79641 Pain in right hand: Secondary | ICD-10-CM | POA: Diagnosis not present

## 2021-12-14 DIAGNOSIS — K219 Gastro-esophageal reflux disease without esophagitis: Secondary | ICD-10-CM

## 2021-12-14 DIAGNOSIS — Z0000 Encounter for general adult medical examination without abnormal findings: Secondary | ICD-10-CM | POA: Diagnosis not present

## 2021-12-14 DIAGNOSIS — Z79899 Other long term (current) drug therapy: Secondary | ICD-10-CM | POA: Diagnosis not present

## 2021-12-14 MED ORDER — SUCRALFATE 1 G PO TABS: 1.0000 g | ORAL_TABLET | Freq: Three times a day (TID) | ORAL | 2 refills | Status: DC

## 2021-12-14 NOTE — Patient Instructions (Addendum)
Thank you for coming to the office today.  Future reconsider the Pneumonia shot updated version PCV-20 or Prevnar-20.  For Mammogram screening for breast cancer   Call the [facility] below anytime to schedule your own appointment now that order has been placed.  [facility] [place], [place] [address] Phone: [phone]  -----------------------------------------------------------  Labs look great except cholesterol elevated. See info below  Recommendations for cholesterol Likely genetic / familial component  Recommend looking into the injectables such as Repatha or Praluent, these are the two injections, antibody PCSK9 inhibitors that do very well for this. No side effects. Injection every 2-4 weeks depending on which one.  Check cost coverage tier. If it is reasonable we can order it, if it is not reasonable, we can seek financial assistance from our pharmacist specialist Grayland Ormond or we can refer to [facility] in [place].  IF you end up starting the new medicine, we can recheck the lipid panel in 3 months after you start to make sure it is working.  Please schedule a Follow-up Appointment to: Return in about 3 months (around 03/14/2022), or if symptoms worsen or fail to improve, for 3 month for cholesterol panel after starting new med..  If you have any other questions or concerns, please feel free to call the office or send a message through [place]. You may also schedule an earlier appointment if necessary.  Additionally, you may be receiving a survey about your experience at our office within a few days to 1 week by e-mail or mail. We value your feedback.  Nobie Putnam, DO [place]

## 2021-12-14 NOTE — Progress Notes (Signed)
Subjective:    Patient ID: Amber Parker, female    DOB: 08-06-1951, 71 y.o.   MRN: [id number]  Amber Parker is a 71 y.o. female presenting on 12/14/2021 for Annual Exam   HPI  Here for Annual Physical and Lab Review  GERD History of H Pylori Last visit 2 months ago, h pylori test negative, treated on PPI 8m and Sucralfate, overall improved. Relief from sucralfate hard to get from pharmacy will need re order. No recent history with GI She was initially treated by her Rheumatologist was on PPI therapy initially, then seem worse has come off med. Questions if MTX is impacting her with side effects. Taking Aleve daily.   Future Align probiotic.   HYPERLIPIDEMIA Recent lab shows elevated LDL still, long term problem, she missed last Cardiology follow-up. She will see Dr KNehemiah Massedto discuss further. Reports chronic history since age [age]s has been on most statin medications with intolerance due to myalgias - Last lipids 09/2018 show elevated lipids LDL 199 - Not on cholesterol med currently   Chronic Rhinosinusitis - Doesnt use Flonase, uses nasal saline several times - Considering anti-histamine   Follow-up Rheumatoid Arthritis Rheumatoid Arthritis / Back of Neck, tightness - had prior injury w/ sprain years ago, now that she is taking Methotrexate, and RA, she is asking if it is more related to rheumatoid arthritis or old injury. She has had some worsening in past 6 months, asking about this, improve with heat, next apt with Rheumatology in November 2019. 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   Additional complaints: - Lower extremity redness, rash - recent problem uncertain onset maybe several months ago, noticed several areas, not painful or itching or spreading. One spot non healing.    Health Maintenance: Declines flu vaccine today, will return if plan to get Defers 2nd PNA vaccine, pneumovax-23, initial dose Prevnar-13 02/01/17 UTD Hep C - UTD Mammogram - UTD  Colonoscopy  [facility] 12/14/2021 09/21/2021 07/23/2019  Decreased Interest 0 0 0  Down, Depressed, Hopeless 0 0 0  PHQ - 2 Score 0 0 0  Altered sleeping 0 0 -  Tired, decreased energy 0 1 -  Change in appetite 0 0 -  Feeling bad or failure about yourself  0 0 -  Trouble concentrating 0 0 -  Moving slowly or fidgety/restless 0 0 -  Suicidal thoughts 0 0 -  PHQ-9 Score 0 1 -  Difficult doing work/chores Not difficult at all Not difficult at all -    Past Medical History:  Diagnosis Date   Carotid artery stenosis    BILATERAL   Hyperlipidemia    Past Surgical History:  Procedure Laterality Date   CHOLECYSTECTOMY     COLONOSCOPY WITH PROPOFOL N/A 12/26/2016   Procedure: COLONOSCOPY WITH PROPOFOL;  Surgeon: RManya Silvas MD;  [facility];  Service: Endoscopy;  Laterality: N/A;   OOPHRECTOMY Bilateral    PARTIAL REMOVAL OF BOTH OVARIES   TONSILLECTOMY     Social History   Socioeconomic History   Marital status: Widowed    Spouse name: Not on file   Number of children: Not on file   Years of education: Not on file   Highest education level: High school graduate  Occupational History   Occupation: retired  Tobacco Use   Smoking status: Never   Smokeless tobacco: Never  Vaping Use   Vaping Use: Never used  Substance and Sexual Activity   Alcohol use:  Drug use: No  ° Sexual activity: Not on file  °Other Topics Concern  ° Not on file  °Social History Narrative  ° Takes care of grandchildren during week   ° °Social Determinants of Health  ° °Financial Resource Strain: Not on file  °Food Insecurity: Not on file  °Transportation Needs: Not on file  °Physical Activity: Not on file  °Stress: Not on file  °Social Connections: Not on file  °Intimate Partner Violence: Not on file  ° °Family History  °Problem Relation Age of Onset  ° Breast cancer Paternal Aunt 80  ° Hypertension Mother   ° Hyperlipidemia Mother   ° Stroke Mother   ° Heart disease  Mother   ° Cancer Mother   °     lung cancer  ° Diabetes Mother   ° COPD Mother   ° Heart attack Mother   ° Neurologic Disorder Father   ° Cancer Father   °     colon cancer  ° °Current Outpatient Medications on File Prior to Visit  °Medication Sig  ° baclofen (LIORESAL) 10 MG tablet Take 0.5-1 tablets (5-10 mg total) by mouth 3 (three) times daily as needed for muscle spasms.  ° diclofenac sodium (VOLTAREN) 1 % GEL APPLY 2 GRAMS 4 TIMES A DAY AS NEEDED, USE ON HANDS FOR ARTHRITIS UP TO 1-2 WEEKS PER FLARE  ° fluticasone (FLONASE) 50 MCG/ACT nasal spray PLACE 2 SPRAY INTO BOTH NOSTRILS ONCE DAILY FOR 4-6 WEEKS THEN STOP AND USE SEASONALLY OR AS NEEDED  ° folic acid (FOLVITE) 1 MG tablet Take 1 tablet by mouth daily.  ° methotrexate (RHEUMATREX) 2.5 MG tablet TAKE 6 TABLETS BY MOUTH SAME DAY WEEKLY WITH FOOD  ° naproxen sodium (ALEVE) 220 MG tablet Take 220 mg by mouth.  ° omeprazole (PRILOSEC) 40 MG capsule Take 1 capsule (40 mg total) by mouth daily before breakfast.  ° sodium chloride (OCEAN) 0.65 % SOLN nasal spray Place 1 spray into both nostrils as needed for congestion.  ° °No current facility-administered medications on file prior to visit.  ° ° °Review of Systems  °Constitutional:  Negative for activity change, appetite change, chills, diaphoresis, fatigue and fever.  °HENT:  Negative for congestion and hearing loss.   °Eyes:  Negative for visual disturbance.  °Respiratory:  Negative for cough, chest tightness, shortness of breath and wheezing.   °Cardiovascular:  Negative for chest pain, palpitations and leg swelling.  °Gastrointestinal:  Negative for abdominal pain, constipation, diarrhea, nausea and vomiting.  °Genitourinary:  Negative for dysuria, frequency and hematuria.  °Musculoskeletal:  Negative for arthralgias and neck pain.  °Skin:  Negative for rash.  °Neurological:  Negative for dizziness, weakness, light-headedness, numbness and headaches.  °Hematological:  Negative for adenopathy.   °Psychiatric/Behavioral:  Negative for behavioral problems, dysphoric mood and sleep disturbance.   °Per HPI unless specifically indicated above ° ° °   °Objective:  °  °BP (!) 115/51    Pulse 85    Ht 5' 4.5" (1.638 m)    Wt 140 lb 3.2 oz (63.6 kg)    SpO2 100%    BMI 23.69 kg/m²   °Wt Readings from Last 3 Encounters:  °12/14/21 140 lb 3.2 oz (63.6 kg)  °09/21/21 138 lb 6.4 oz (62.8 kg)  °10/03/18 129 lb (58.5 kg)  °  °Physical Exam °Vitals and nursing note reviewed.  °Constitutional:   °   General: She is not in acute distress. °   Appearance: She is well-developed. She is not   diaphoretic.  °   Comments: Well-appearing, comfortable, cooperative  °HENT:  °   Head: Normocephalic and atraumatic.  °Eyes:  °   General:     °   Right eye: No discharge.     °   Left eye: No discharge.  °   Conjunctiva/sclera: Conjunctivae normal.  °   Pupils: Pupils are equal, round, and reactive to light.  °Neck:  °   Thyroid: No thyromegaly.  °   Vascular: No carotid bruit.  °Cardiovascular:  °   Rate and Rhythm: Normal rate and regular rhythm.  °   Pulses: Normal pulses.  °   Heart sounds: Normal heart sounds. No murmur heard. °Pulmonary:  °   Effort: Pulmonary effort is normal. No respiratory distress.  °   Breath sounds: Normal breath sounds. No wheezing or rales.  °Abdominal:  °   General: Bowel sounds are normal. There is no distension.  °   Palpations: Abdomen is soft. There is no mass.  °   Tenderness: There is no abdominal tenderness.  °Musculoskeletal:     °   General: No tenderness. Normal range of motion.  °   Cervical back: Normal range of motion and neck supple.  °   Right lower leg: No edema.  °   Left lower leg: No edema.  °   Comments: Upper / Lower Extremities: °- Normal muscle tone, strength bilateral upper extremities 5/5, lower extremities 5/5  °Lymphadenopathy:  °   Cervical: No cervical adenopathy.  °Skin: °   General: Skin is warm and dry.  °   Findings: No erythema or rash.  °Neurological:  °   Mental Status:  She is alert and oriented to person, place, and time.  °   Comments: Distal sensation intact to light touch all extremities  °Psychiatric:     °   Mood and Affect: Mood normal.     °   Behavior: Behavior normal.     °   Thought Content: Thought content normal.  °   Comments: Well groomed, good eye contact, normal speech and thoughts  ° °Results for orders placed or performed in visit on 09/21/21  °Lipid panel  °Result Value Ref Range  ° Cholesterol, Total 327 (H) 100 - 199 mg/dL  ° Triglycerides 180 (H) 0 - 149 mg/dL  ° HDL 59 >39 mg/dL  ° VLDL Cholesterol Cal 35 5 - 40 mg/dL  ° LDL Chol Calc (NIH) 233 (H) 0 - 99 mg/dL  ° Lipid Comment: Comment   ° Chol/HDL Ratio 5.5 (H) 0.0 - 4.4 ratio  °Hemoglobin A1c  °Result Value Ref Range  ° Hgb A1c MFr Bld 5.2 4.8 - 5.6 %  ° Est. average glucose Bld gHb Est-mCnc 103 mg/dL  °TSH  °Result Value Ref Range  ° TSH 2.890 0.450 - 4.500 uIU/mL  °Comprehensive metabolic panel  °Result Value Ref Range  ° Glucose 82 70 - 99 mg/dL  ° BUN 11 8 - 27 mg/dL  ° Creatinine, Ser 0.64 0.57 - 1.00 mg/dL  ° eGFR 94 >59 mL/min/1.73  ° BUN/Creatinine Ratio 17 12 - 28  ° Sodium 141 134 - 144 mmol/L  ° Potassium 4.4 3.5 - 5.2 mmol/L  ° Chloride 102 96 - 106 mmol/L  ° CO2 27 20 - 29 mmol/L  ° Calcium 10.5 (H) 8.7 - 10.3 mg/dL  ° Total Protein 7.4 6.0 - 8.5 g/dL  ° Albumin 5.1 (H) 3.7 - 4.7 g/dL  ° Globulin, Total 2.3   Globulin, Total 2.3 1.5 - 4.5 g/dL   Albumin/Globulin Ratio 2.2 1.2 - 2.2   Bilirubin Total 0.4 0.0 - 1.2 mg/dL   Alkaline Phosphatase 81 44 - 121 IU/L   AST 32 0 - 40 IU/L   ALT 31 0 - 32 IU/L      Assessment & Plan:   Problem List Items Addressed This Visit     Rheumatoid arthritis ([place])   Gastroesophageal reflux disease without esophagitis   Relevant Medications   sucralfate (CARAFATE) 1 g tablet   Familial hypercholesteremia   Other Visit Diagnoses     Annual physical exam    -  Primary   Encounter for screening mammogram for malignant neoplasm of breast       Relevant Orders   MM  3D SCREEN BREAST BILATERAL       Updated Health Maintenance information Reviewed recent lab results with patient Encouraged improvement to lifestyle with diet and exercise Goal of weight loss  RA Followed by Rheumatology On medication management  Familial Hyperlipidemia LDL calc >200 The ASCVD Risk score (Arnett DK, et al., 2019) failed to calculate for the following reasons:   The valid total cholesterol range is 130 to 320 mg/dL Failed multiple statin treatments. Recommend PCSK9 inhibitor, handout given she will check tier cost coverage, notify us if when ready to order rx. May need CCM assistance for financial if high cost.  GERD Will re order Sucralfate PRN Continue PPI for now, refer to GI in future if not improving.  Orders Placed This Encounter  Procedures   MM 3D SCREEN BREAST BILATERAL    Standing Status:   Future    Standing Expiration Date:   12/14/2022    Order Specific Question:   Reason for Exam (SYMPTOM  OR DIAGNOSIS REQUIRED)    Answer:   Screening bilateral 3D Mammogram [place]    Order Specific Question:   Preferred imaging location?    Answer:   [facility] ordered this encounter  Medications   sucralfate (CARAFATE) 1 g tablet    Sig: Take 1 tablet (1 g total) by mouth 4 (four) times daily -  with meals and at bedtime. As needed for abdominal pain    Dispense:  40 tablet    Refill:  2      Follow up plan: Return in about 3 months (around 03/14/2022), or if symptoms worsen or fail to improve, for 3 month for cholesterol panel after starting new med.Nobie Putnam, DO [facility] 12/14/2021, 1:36 PM

## 2022-01-03 ENCOUNTER — Other Ambulatory Visit: Payer: Self-pay | Admitting: Family Medicine

## 2022-01-03 DIAGNOSIS — K219 Gastro-esophageal reflux disease without esophagitis: Secondary | ICD-10-CM

## 2022-01-03 NOTE — Telephone Encounter (Signed)
Requested Prescriptions  Pending Prescriptions Disp Refills   omeprazole (PRILOSEC) 40 MG capsule [Pharmacy Med Name: OMEPRAZOLE DR 40 MG CAP] 90 capsule 1    Sig: TAKE 1 CAPSULE BY MOUTH ONCE DAILY BEFORE BREAKFAST     Gastroenterology: Proton Pump Inhibitors Passed - 01/03/2022 10:27 AM      Passed - Valid encounter within last 12 months    Recent Outpatient Visits          2 weeks ago Annual physical exam   [place], DO   3 months ago Gastroesophageal reflux disease without esophagitis   [place], DO   1 year ago Acute non-recurrent frontal sinusitis   [facility] [place], Devonne Doughty, DO   3 years ago Annual physical exam   [facility] Olin Hauser, DO   4 years ago Bilateral hand swelling   [place], Devonne Doughty, [place]

## 2022-01-24 ENCOUNTER — Ambulatory Visit
Admission: RE | Admit: 2022-01-24 | Discharge: 2022-01-24 | Disposition: A | Discharge status: Home / Self Care | Payer: PPO | Source: Ambulatory Visit | Attending: Family Medicine | Admitting: Family Medicine

## 2022-01-24 ENCOUNTER — Other Ambulatory Visit: Payer: Self-pay

## 2022-01-24 DIAGNOSIS — Z1231 Encounter for screening mammogram for malignant neoplasm of breast: Secondary | ICD-10-CM | POA: Diagnosis not present

## 2022-02-15 DIAGNOSIS — H353131 Nonexudative age-related macular degeneration, bilateral, early dry stage: Secondary | ICD-10-CM | POA: Diagnosis not present

## 2022-02-23 DIAGNOSIS — Z79899 Other long term (current) drug therapy: Secondary | ICD-10-CM | POA: Diagnosis not present

## 2022-02-23 DIAGNOSIS — M0579 Rheumatoid arthritis with rheumatoid factor of multiple sites without organ or systems involvement: Secondary | ICD-10-CM | POA: Diagnosis not present

## 2022-03-22 ENCOUNTER — Telehealth: Payer: Self-pay | Admitting: Family Medicine

## 2022-03-22 NOTE — Telephone Encounter (Signed)
N/A unable to leave a message for patient to call back and schedule the Medicare Annual Wellness Visit (AWV) virtually or by telephone. ? ?Last AWV 07/23/19 ? ?Please schedule at anytime with [facility]. ? ?Any questions, please call me at [phone]  ?

## 2022-05-02 ENCOUNTER — Telehealth: Payer: Self-pay

## 2022-05-02 NOTE — Telephone Encounter (Signed)
Left a message for patient to call back and schedule their Medicare Annual Wellness Visit (AWV) virtually, telephone or face to face. ?  ?Dasiah Hooley, CMA ?[phone]  ?

## 2022-05-24 ENCOUNTER — Telehealth: Payer: Self-pay | Admitting: Family Medicine

## 2022-05-24 DIAGNOSIS — Z79899 Other long term (current) drug therapy: Secondary | ICD-10-CM | POA: Diagnosis not present

## 2022-05-24 DIAGNOSIS — M0579 Rheumatoid arthritis with rheumatoid factor of multiple sites without organ or systems involvement: Secondary | ICD-10-CM | POA: Diagnosis not present

## 2022-05-24 NOTE — Telephone Encounter (Signed)
Pt was seeing her rheumatologist every 6 months and they have retired and pt is wanting to know if Dr. Raliegh Ip can resume her care / her diagnoses is stable at this time / please advise

## 2022-05-24 NOTE — Telephone Encounter (Signed)
Correct.  Unfortunately I am not comfortable managing RA on methotrexate long term.  As far as I know, there should be other Rheumatologist at [facility] that can assist her.  Alternatively we could consider [facility] if she needs.  I would only be able to do a temporary order for example 1-3 months as she waits on a new apt if she runs out of med. But really I would recommend having a new rheumatologist.  Nobie Putnam, [facility] 05/24/2022, 2:48 PM

## 2022-08-02 DIAGNOSIS — Z79899 Other long term (current) drug therapy: Secondary | ICD-10-CM | POA: Diagnosis not present

## 2022-08-02 DIAGNOSIS — M0579 Rheumatoid arthritis with rheumatoid factor of multiple sites without organ or systems involvement: Secondary | ICD-10-CM | POA: Diagnosis not present

## 2022-08-02 DIAGNOSIS — Z13820 Encounter for screening for osteoporosis: Secondary | ICD-10-CM | POA: Diagnosis not present

## 2022-08-04 DIAGNOSIS — Z13820 Encounter for screening for osteoporosis: Secondary | ICD-10-CM | POA: Diagnosis not present

## 2022-08-09 DIAGNOSIS — M810 Age-related osteoporosis without current pathological fracture: Secondary | ICD-10-CM | POA: Diagnosis not present

## 2022-08-30 DIAGNOSIS — M069 Rheumatoid arthritis, unspecified: Secondary | ICD-10-CM | POA: Diagnosis not present

## 2022-08-30 DIAGNOSIS — Z79899 Other long term (current) drug therapy: Secondary | ICD-10-CM | POA: Diagnosis not present

## 2022-09-23 IMAGING — MG MM DIGITAL SCREENING BILAT W/ TOMO AND CAD
8 series · 9 of 24 positions shown · non-contrast
Comparison: Previous exam(s).

CLINICAL DATA: Screening.

EXAM:
DIGITAL SCREENING BILATERAL MAMMOGRAM WITH TOMOSYNTHESIS AND CAD
TECHNIQUE: Bilateral screening digital craniocaudal and mediolateral oblique
mammograms were obtained. Bilateral screening digital breast
tomosynthesis was performed. The images were evaluated with
computer-aided detection.

[L CC synth-2D]
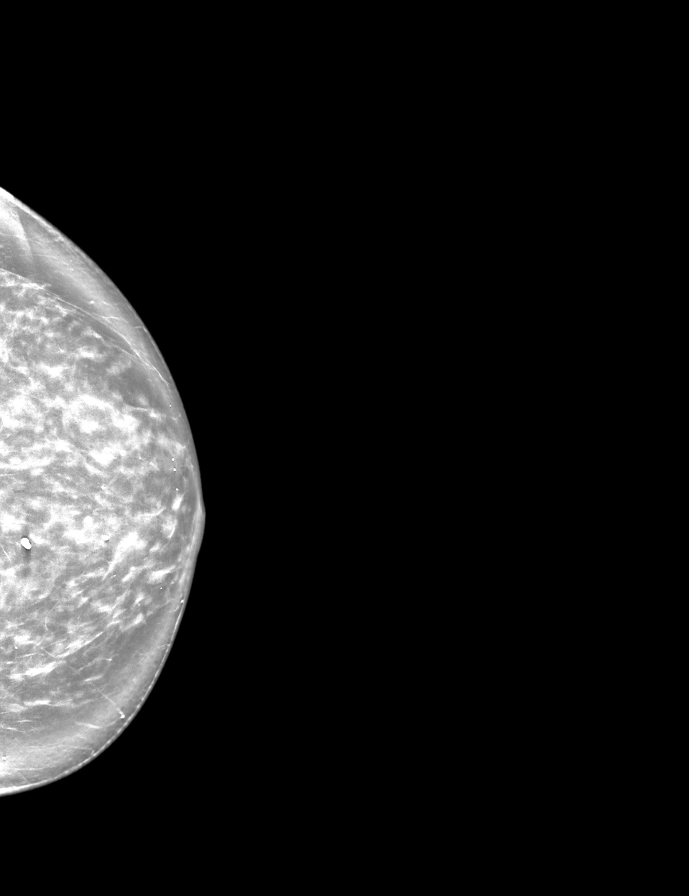

[L MLO synth-2D]
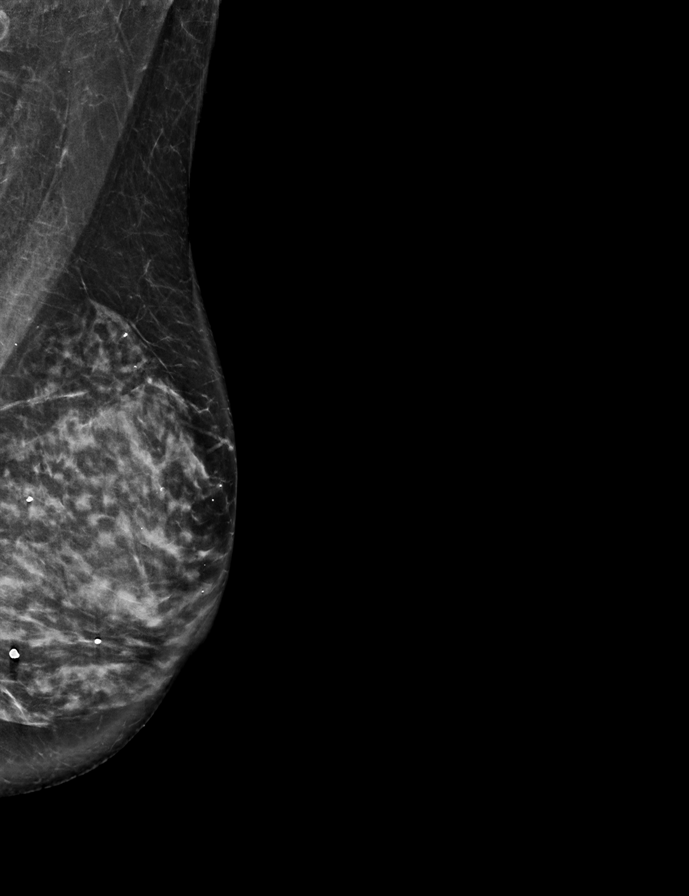

[R MLO synth-2D]
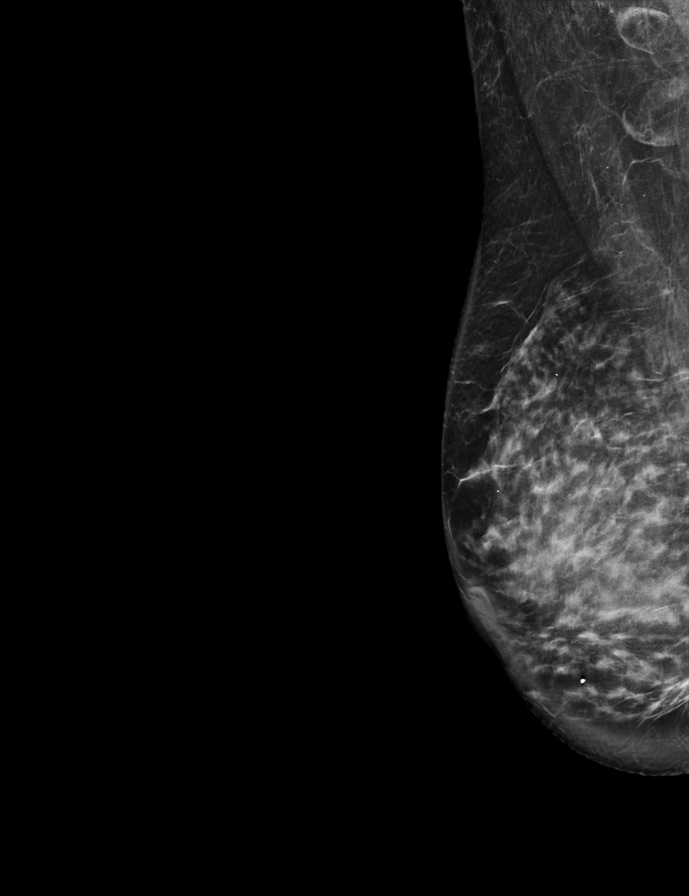

[R CC synth-2D]
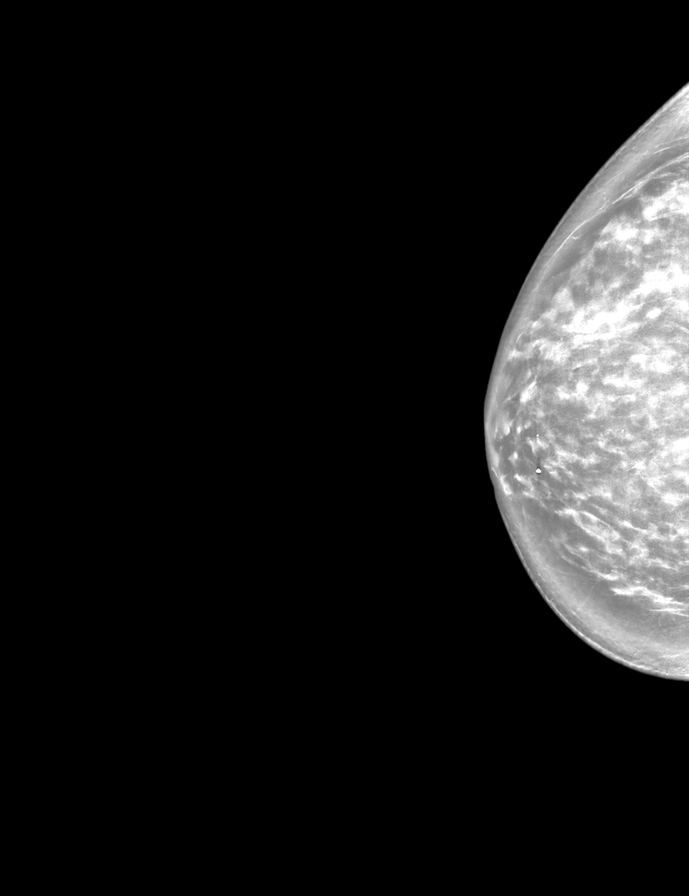

[R CC tomo · 2 of 76 frames shown]
[frame 25/76]
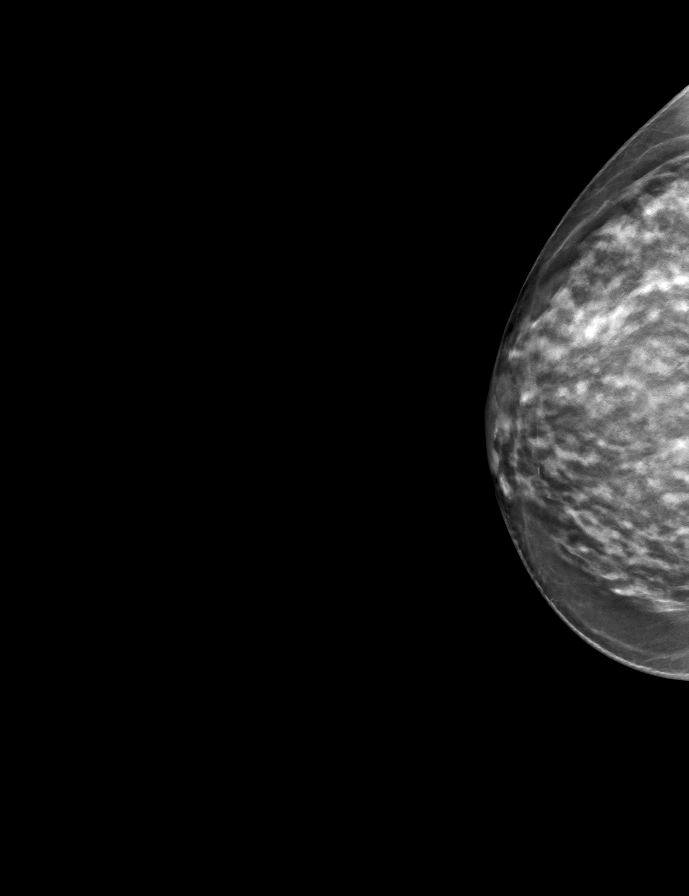
[frame 39/76]
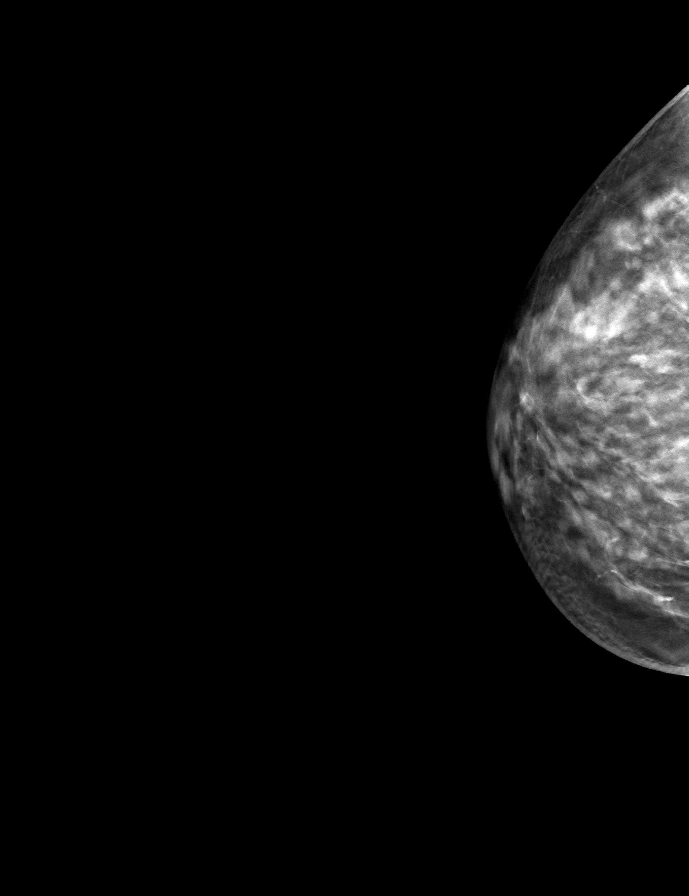

[R MLO tomo · tomo slice 40/79.0]
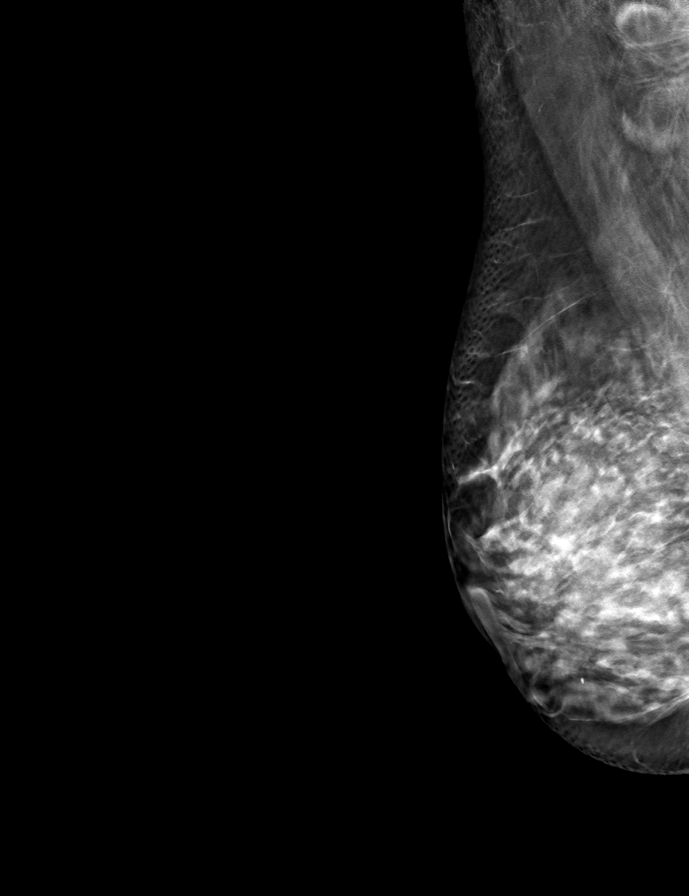

[L MLO tomo · tomo slice 39/78.0]
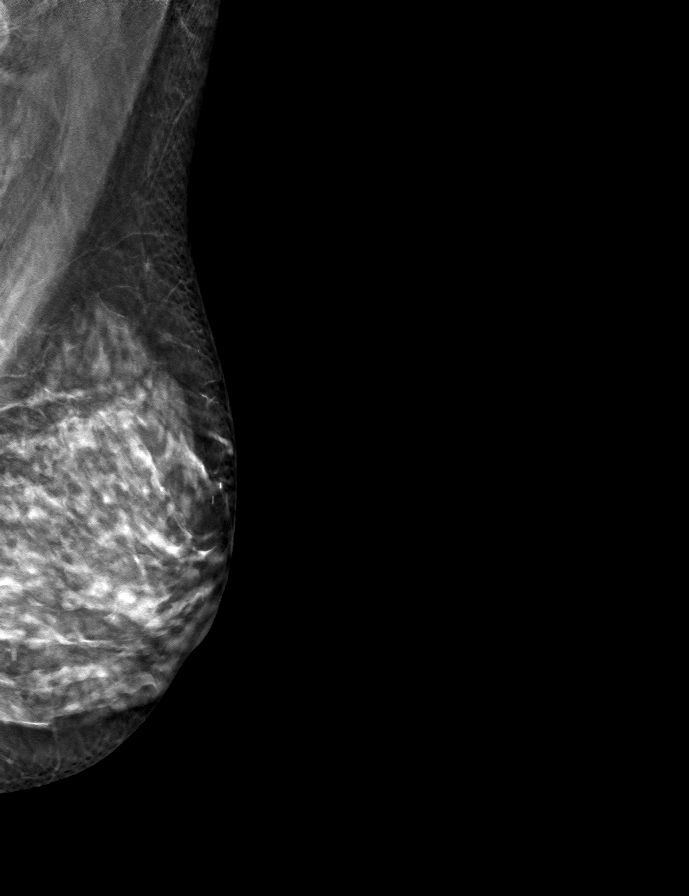

[L CC tomo · tomo slice 41/82.0]
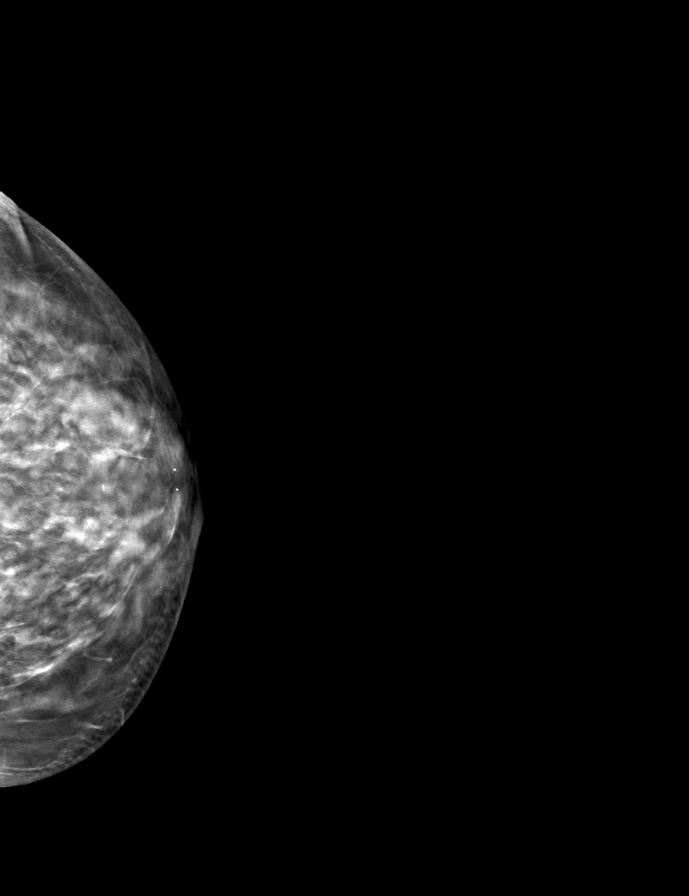

[9 of 24 positions shown; findings below may reference images not displayed]

ACR Breast Density Category d: The breast tissue is extremely dense,
which lowers the sensitivity of mammography
FINDINGS: There are no findings suspicious for malignancy.
IMPRESSION: No mammographic evidence of malignancy. A result letter of this
screening mammogram will be mailed directly to the patient.

RECOMMENDATION:
Screening mammogram in one year. (Code:[id number]-[id number])

BI-RADS CATEGORY  1: Negative.

## 2022-11-08 ENCOUNTER — Telehealth: Payer: Self-pay | Admitting: Family Medicine

## 2022-11-08 NOTE — Telephone Encounter (Signed)
N/A unable to leave a message for patient to call back and schedule the Medicare Annual Wellness Visit (AWV) virtually or by telephone.  Last AWV 07/23/19  Please schedule at anytime with [place].  30 minute appointment  Any questions, please call me at [phone]

## 2022-11-18 ENCOUNTER — Ambulatory Visit: Payer: Self-pay

## 2022-11-18 NOTE — Telephone Encounter (Signed)
Summary: Fever, sore throat, cough   The patient called in stating she has had a fever, sore throat and a cough on and off since the week of Thanksgiving. She has been using Mucinex D and it has helped a little but then the fever started. Her most recent temperature is 100. Please assist patient further       Chief Complaint: fever started last night , cough productive requesting medication  Symptoms: URI sx since week of thanksgiving. Fever started last night 101 took tylenol and this am 100. Cough productive green mucus coughed up. Covid test negative at home today. Taking mucinex since last week for sx.  Frequency: fever last night  Pertinent Negatives: Patient denies chest pain no difficulty breathing  Disposition: _0 ED /_1 Urgent Care (no appt availability in office) / _2 Appointment(In office/virtual)/ _3  [facility]/ _4 Home Care/ _5 Refused Recommended Disposition /_6 [place] [facility]/ _7  Follow-up with PCP Additional Notes:   Recommended UC due to no available appt. Patient reports she will wait for PCP for advise if needed. Please advise      Reason for Disposition  Fever present > 3 days (72 hours)    Fever started lst night  Answer Assessment - Initial Assessment Questions 1. ONSET: "When did the cough begin?"      Sx since week of Thanksgiving 2. SEVERITY: "How bad is the cough today?"      Productive cough  3. SPUTUM: "Describe the color of your sputum" (none, dry cough; clear, white, yellow, green)     Green  4. HEMOPTYSIS: "Are you coughing up any blood?" If so ask: "How much?" (flecks, streaks, tablespoons, etc.)     na 5. DIFFICULTY BREATHING: "Are you having difficulty breathing?" If Yes, ask: "How bad is it?" (e.g., mild, moderate, severe)    - MILD: No SOB at rest, mild SOB with walking, speaks normally in sentences, can lie down, no retractions, pulse < 100.    - MODERATE: SOB at rest, SOB with minimal exertion and prefers to sit, cannot lie down  flat, speaks in phrases, mild retractions, audible wheezing, pulse 100-120.    - SEVERE: Very SOB at rest, speaks in single words, struggling to breathe, sitting hunched forward, retractions, pulse > 120      Denies shortness of breath 6. FEVER: "Do you have a fever?" If Yes, ask: "What is your temperature, how was it measured, and when did it start?"     Temp 100 7. CARDIAC HISTORY: "Do you have any history of heart disease?" (e.g., heart attack, congestive heart failure)      na 8. LUNG HISTORY: "Do you have any history of lung disease?"  (e.g., pulmonary embolus, asthma, emphysema)     na 9. PE RISK FACTORS: "Do you have a history of blood clots?" (or: recent major surgery, recent prolonged travel, bedridden)     na 10. OTHER SYMPTOMS: "Do you have any other symptoms?" (e.g., runny nose, wheezing, chest pain)        Cough productive green mucus, has had URI sx since thanksgiving and last light developed fever 101 today temp 100. Taking mucinex   11. PREGNANCY: "Is there any chance you are pregnant?" "When was your last menstrual period?"       na 12. TRAVEL: "Have you traveled out of the country in the last month?" (e.g., travel history, exposures)       na  Protocols used: Cough - Acute Productive-A-AH

## 2022-11-18 NOTE — Telephone Encounter (Signed)
Patient called, left VM to return the call to the office to discuss symptoms with a nurse.  Summary: Fever, sore throat, cough   The patient called in stating she has had a fever, sore throat and a cough on and off since the week of Thanksgiving. She has been using Mucinex D and it has helped a little but then the fever started. Her most recent temperature is 100. Please assist patient further

## 2022-11-18 NOTE — Telephone Encounter (Signed)
We recommend alternating Tylenol and Ibuprofen OTC as needed for fever. Given the duration of her symptoms, she needs evaluation either in office or at [facility].

## 2022-11-21 DIAGNOSIS — R051 Acute cough: Secondary | ICD-10-CM | POA: Diagnosis not present

## 2022-11-21 DIAGNOSIS — R509 Fever, unspecified: Secondary | ICD-10-CM | POA: Diagnosis not present

## 2022-11-21 DIAGNOSIS — J329 Chronic sinusitis, unspecified: Secondary | ICD-10-CM | POA: Diagnosis not present

## 2023-01-09 DIAGNOSIS — H903 Sensorineural hearing loss, bilateral: Secondary | ICD-10-CM | POA: Diagnosis not present

## 2023-01-09 DIAGNOSIS — J329 Chronic sinusitis, unspecified: Secondary | ICD-10-CM | POA: Diagnosis not present

## 2023-01-09 DIAGNOSIS — H6123 Impacted cerumen, bilateral: Secondary | ICD-10-CM | POA: Diagnosis not present

## 2023-01-23 DIAGNOSIS — M0579 Rheumatoid arthritis with rheumatoid factor of multiple sites without organ or systems involvement: Secondary | ICD-10-CM | POA: Diagnosis not present

## 2023-01-23 DIAGNOSIS — Z79899 Other long term (current) drug therapy: Secondary | ICD-10-CM | POA: Diagnosis not present

## 2023-01-25 ENCOUNTER — Encounter: Payer: Self-pay | Admitting: Family Medicine

## 2023-01-25 ENCOUNTER — Ambulatory Visit (INDEPENDENT_AMBULATORY_CARE_PROVIDER_SITE_OTHER): Payer: PPO | Admitting: Family Medicine

## 2023-01-25 VITALS — BP 122/64 | HR 89 | Ht 64.5 in | Wt 134.8 lb

## 2023-01-25 DIAGNOSIS — R350 Frequency of micturition: Secondary | ICD-10-CM

## 2023-01-25 DIAGNOSIS — N3001 Acute cystitis with hematuria: Secondary | ICD-10-CM | POA: Diagnosis not present

## 2023-01-25 DIAGNOSIS — B379 Candidiasis, unspecified: Secondary | ICD-10-CM | POA: Diagnosis not present

## 2023-01-25 LAB — POCT URINALYSIS DIPSTICK
Bilirubin, UA: NEGATIVE
Glucose, UA: NEGATIVE
Ketones, UA: NEGATIVE
Nitrite, UA: NEGATIVE
Protein, UA: NEGATIVE
Spec Grav, UA: 1.01 (ref 1.010–1.025)
Urobilinogen, UA: 0.2 E.U./dL
pH, UA: 7.5 (ref 5.0–8.0)

## 2023-01-25 MED ORDER — FLUCONAZOLE 150 MG PO TABS: ORAL_TABLET | ORAL | 0 refills | Status: DC

## 2023-01-25 MED ORDER — CEPHALEXIN 500 MG PO CAPS: 500.0000 mg | ORAL_CAPSULE | Freq: Three times a day (TID) | ORAL | 0 refills | Status: DC

## 2023-01-25 NOTE — Patient Instructions (Addendum)
Thank you for coming to the office today.  1. You have a Urinary Tract Infection - this is very common, your symptoms are reassuring and you should get better within 1 week on the antibiotics - Start Keflex 59m 3 times daily for next 7 days, complete entire course, even if feeling better - We sent urine for a culture, we will call you within next few days if we need to change antibiotics - Please drink plenty of fluids, improve hydration over next 1 week   Also added Diflucan for yeast, after finish antibiotics  If symptoms worsening, developing nausea / vomiting, worsening back pain, fevers / chills / sweats, then please return for re-evaluation sooner.  If you take AZO OTC - limit this to 2-3 days MAX to avoid affecting kidneys  D-Mannose is a natural supplement that can actually help bind to urinary bacteria and reduce their effectiveness it can help prevent UTI from forming, and may reduce some symptoms. It likely cannot cure an active UTI but it is worth a try and good to prevent them with. Try [id number] a day at a full dose if you want, or check package instructions for more info   Please schedule a Follow-up Appointment to: Return if symptoms worsen or fail to improve, for UTI.  If you have any other questions or concerns, please feel free to call the office or send a message through [place] may also schedule an earlier appointment if necessary.  Additionally, you may be receiving a survey about your experience at our office within a few days to 1 week by e-mail or mail. We value your feedback.  AlNobie PutnamDO [facility]

## 2023-01-25 NOTE — Progress Notes (Signed)
Subjective:    Patient ID: Amber Parker, female    DOB: Apr 21, 1951, 72 y.o.   MRN: [id number]  Amber Parker is a 72 y.o. female presenting on 01/25/2023 for Urinary Frequency and Urinary Tract Infection   HPI  Urinary Tract Infection, suspected No prior history of UTI in the past. Recent history sinusitis about 2 months ago, treated with Augmentin at that time She has described urinary frequency, urgency, dysuria, some low back pain Has seen ENT and on prednisone. She said now has yeast infection it was treated with OTC monostat and also Diflucan per ENT now improved. Admits some urinary color change but not seen blood Denies fevers chills nausea vomiting      12/14/2021    1:27 PM 09/21/2021    2:02 PM 07/23/2019    1:35 PM  Depression screen PHQ 2/9  Decreased Interest 0 0 0  Down, Depressed, Hopeless 0 0 0  PHQ - 2 Score 0 0 0  Altered sleeping 0 0   Tired, decreased energy 0 1   Change in appetite 0 0   Feeling bad or failure about yourself  0 0   Trouble concentrating 0 0   Moving slowly or fidgety/restless 0 0   Suicidal thoughts 0 0   PHQ-9 Score 0 1   Difficult doing work/chores Not difficult at all Not difficult at all     Social History   Tobacco Use   Smoking status: Never   Smokeless tobacco: Never  Vaping Use   Vaping Use: Never used  Substance Use Topics   Alcohol use: No   Drug use: No    Review of Systems Per HPI unless specifically indicated above     Objective:    BP 122/64   Pulse 89   Ht 5' 4.5" (1.638 m)   Wt 134 lb 12.8 oz (61.1 kg)   SpO2 99%   BMI 22.78 kg/m   Wt Readings from Last 3 Encounters:  01/25/23 134 lb 12.8 oz (61.1 kg)  12/14/21 140 lb 3.2 oz (63.6 kg)  09/21/21 138 lb 6.4 oz (62.8 kg)    Physical Exam Vitals and nursing note reviewed.  Constitutional:      General: She is not in acute distress.    Appearance: Normal appearance. She is well-developed. She is not diaphoretic.     Comments: Well-appearing,  comfortable, cooperative  HENT:     Head: Normocephalic and atraumatic.  Eyes:     General:        Right eye: No discharge.        Left eye: No discharge.     Conjunctiva/sclera: Conjunctivae normal.  Cardiovascular:     Rate and Rhythm: Normal rate.  Pulmonary:     Effort: Pulmonary effort is normal.  Skin:    General: Skin is warm and dry.     Findings: No erythema or rash.  Neurological:     Mental Status: She is alert and oriented to person, place, and time.  Psychiatric:        Mood and Affect: Mood normal.        Behavior: Behavior normal.        Thought Content: Thought content normal.     Comments: Well groomed, good eye contact, normal speech and thoughts    Results for orders placed or performed in visit on 01/25/23  POCT Urinalysis Dipstick  Result Value Ref Range   Color, UA Yellow    Clarity, UA Cloudy  Glucose, UA Negative Negative   Bilirubin, UA Negative    Ketones, UA Negative    Spec Grav, UA 1.010 1.010 - 1.025   Blood, UA Moderate ++    pH, UA 7.5 5.0 - 8.0   Protein, UA Negative Negative   Urobilinogen, UA 0.2 0.2 or 1.0 E.U./dL   Nitrite, UA Negative    Leukocytes, UA Large (3+) (A) Negative   Appearance     Odor        Assessment & Plan:   Problem List Items Addressed This Visit   None Visit Diagnoses     Acute cystitis with hematuria    -  Primary   Relevant Medications   cephALEXin (KEFLEX) 500 MG capsule   Urinary frequency       Relevant Orders   POCT Urinalysis Dipstick (Completed)   Urine Culture   Antibiotic-induced yeast infection       Relevant Medications   cephALEXin (KEFLEX) 500 MG capsule   fluconazole (DIFLUCAN) 150 MG tablet       Clinically consistent with UTI and confirmed on UA Last antibiotic augmentin in December. No prior UTI reported No concern for pyelo today (no systemic symptoms, neg fever, back pain, n/v).  Plan: Urinalysis dipstick mod blood large leuks Ordered Urine culture 3. Keflex 523m TID  x 7 days - add Diflucan if need after for yeast infection 4. Improve PO hydration    Meds ordered this encounter  Medications   cephALEXin (KEFLEX) 500 MG capsule    Sig: Take 1 capsule (500 mg total) by mouth 3 (three) times daily. For 7 days    Dispense:  21 capsule    Refill:  0   fluconazole (DIFLUCAN) 150 MG tablet    Sig: Take one tablet by mouth on Day 1. Repeat dose 2nd tablet on Day 3.    Dispense:  2 tablet    Refill:  0     Follow up plan: Return if symptoms worsen or fail to improve, for UTI.   ANobie Putnam DO [facility] 01/25/2023, 11:27 AM

## 2023-01-26 DIAGNOSIS — M0579 Rheumatoid arthritis with rheumatoid factor of multiple sites without organ or systems involvement: Secondary | ICD-10-CM | POA: Diagnosis not present

## 2023-01-27 LAB — URINE CULTURE
MICRO NUMBER:: 14564664
SPECIMEN QUALITY:: ADEQUATE

## 2023-02-01 ENCOUNTER — Other Ambulatory Visit: Payer: Self-pay | Admitting: Family Medicine

## 2023-02-01 ENCOUNTER — Ambulatory Visit: Payer: Self-pay

## 2023-02-01 DIAGNOSIS — N3001 Acute cystitis with hematuria: Secondary | ICD-10-CM

## 2023-02-01 MED ORDER — CIPROFLOXACIN HCL 500 MG PO TABS: 500.0000 mg | ORAL_TABLET | Freq: Two times a day (BID) | ORAL | 0 refills | Status: AC

## 2023-02-01 NOTE — Telephone Encounter (Signed)
Patient called, left VM to return the call to the office to discuss medication with a nurse.  Urine culture.   It shows a fairly small amount of bacteria. It is actually resistant to several antibiotics. Unfortunately it is resistant to the Keflex antibiotic that we prescribed.   Please let us know how you are doing and if symptoms are mostly resolved, you can finish the course. If it is not improving, we will need to hear back from you so we can switch the antibiotic.   Nobie Putnam, DO [facility] 01/27/2023, 7:06 PM  Summary: cephALEXin (KEFLEX) 500 MG capsule?   The patient called in stating she would like to talk to her provider about the medication prescribed. She was prescribed cephALEXin (KEFLEX) 500 MG capsule and it is helping but it is not fully gone yet. Please assist patient further and please leave a message if unable to get a hold of the patient.

## 2023-02-01 NOTE — Telephone Encounter (Signed)
I attempted to call her back but she did not answer. I LVM with info that she should pick up new antibiotic now.  Nobie Putnam, [facility] 02/01/2023, 3:29 PM

## 2023-02-01 NOTE — Telephone Encounter (Signed)
  Chief Complaint: Medication Question Symptoms:  Frequency:  Pertinent Negatives: Patient denies  Disposition: []$ ED /[]$ Urgent Care (no appt availability in office) / []$ Appointment(In office/virtual)/ []$  [place] [facility]/ []$ Home Care/ []$ Refused Recommended Disposition /[]$ [place] [facility]/ [x]$  Follow-up with PCP Additional Notes:   Pt reports still with dysuria,(burning)  lower back pain is better but remains. Has pressure in pelvic area. States frequency has resolved. Took last Keflex today. Please advise.

## 2023-02-21 ENCOUNTER — Ambulatory Visit: Payer: Self-pay

## 2023-02-21 DIAGNOSIS — H25813 Combined forms of age-related cataract, bilateral: Secondary | ICD-10-CM | POA: Diagnosis not present

## 2023-02-21 DIAGNOSIS — N3001 Acute cystitis with hematuria: Secondary | ICD-10-CM

## 2023-02-21 DIAGNOSIS — H353131 Nonexudative age-related macular degeneration, bilateral, early dry stage: Secondary | ICD-10-CM | POA: Diagnosis not present

## 2023-02-21 NOTE — Telephone Encounter (Signed)
  Chief Complaint: Wants more keflex Symptoms: Urgency & burning  Frequency: 01/25/2023 Pertinent Negatives: Patient denies  Disposition: [] ED /[] Urgent Care (no appt availability in office) / [] Appointment(In office/virtual)/ []  [place] [facility]/ [] Home Care/ [] Refused Recommended Disposition /[] [place] [facility]/ [x]  Follow-up with PCP Additional Notes: Pt states that s/s of UTI are better , but completely resolved. Pt still has burning and urgency. Pt would like another rx for keflex sent to pharmacy.   Pt also need a referral for a mammogram.  Please advise.   Summary: UTI   Pt is being treated for a UTI / and it has not cleared up / she would like  to ask Dr. Marthann Schiller nurse for another round of Keflex/ please advise        Reason for Disposition  [1] Prescription refill request for ESSENTIAL medicine (i.e., likelihood of harm to patient if not taken) AND [2] triager unable to refill per department policy  Answer Assessment - Initial Assessment Questions 1. DRUG NAME: "What medicine do you need to have refilled?"     Keflex 2. REFILLS REMAINING: "How many refills are remaining?" (Note: The label on the medicine or pill bottle will show how many refills are remaining. If there are no refills remaining, then a renewal may be needed.)     none 3. EXPIRATION DATE: "What is the expiration date?" (Note: The label states when the prescription will expire, and thus can no longer be refilled.)      4. PRESCRIBING HCP: "Who prescribed it?" Reason: If prescribed by specialist, call should be referred to that group.     Dr. Parks Ranger 5. SYMPTOMS: "Do you have any symptoms?"     Burning and urgency  Protocols used: Medication Refill and Renewal Call-A-AH

## 2023-02-22 MED ORDER — CIPROFLOXACIN HCL 500 MG PO TABS: 500.0000 mg | ORAL_TABLET | Freq: Two times a day (BID) | ORAL | 0 refills | Status: AC

## 2023-02-22 NOTE — Telephone Encounter (Signed)
Called patient. She failed keflex initially, due to resistance, and we switched to Cipro, in Feb she did 500 TWICE A DAY x 5 days, and improved but not resolved. She treated yeast infection too. Now she asks about repeat antibiotic Cipro 500 twice a day, advised on muscle tendon injury risk.  Note the call earlier says Keflex, but her UTi was resistant to [place] 02/22/2023, 10:30 AM

## 2023-02-22 NOTE — Addendum Note (Signed)
Addended by: Olin Hauser on: 02/22/2023 10:30 AM   Modules accepted: Orders

## 2023-04-14 DIAGNOSIS — H43811 Vitreous degeneration, right eye: Secondary | ICD-10-CM | POA: Diagnosis not present

## 2023-04-14 DIAGNOSIS — H353131 Nonexudative age-related macular degeneration, bilateral, early dry stage: Secondary | ICD-10-CM | POA: Diagnosis not present

## 2023-04-14 DIAGNOSIS — H2513 Age-related nuclear cataract, bilateral: Secondary | ICD-10-CM | POA: Diagnosis not present

## 2023-04-14 DIAGNOSIS — H18513 Endothelial corneal dystrophy, bilateral: Secondary | ICD-10-CM | POA: Diagnosis not present

## 2023-05-24 DIAGNOSIS — M0579 Rheumatoid arthritis with rheumatoid factor of multiple sites without organ or systems involvement: Secondary | ICD-10-CM | POA: Diagnosis not present

## 2023-05-24 DIAGNOSIS — Z79899 Other long term (current) drug therapy: Secondary | ICD-10-CM | POA: Diagnosis not present

## 2023-07-12 ENCOUNTER — Telehealth: Payer: Self-pay

## 2023-07-12 NOTE — Telephone Encounter (Signed)
LVM for patient to call back [phone], or to call PCP office to schedule follow up apt. AS, CMA  

## 2023-09-19 DIAGNOSIS — Z79899 Other long term (current) drug therapy: Secondary | ICD-10-CM | POA: Diagnosis not present

## 2023-09-19 DIAGNOSIS — M0579 Rheumatoid arthritis with rheumatoid factor of multiple sites without organ or systems involvement: Secondary | ICD-10-CM | POA: Diagnosis not present

## 2023-09-20 DIAGNOSIS — M0579 Rheumatoid arthritis with rheumatoid factor of multiple sites without organ or systems involvement: Secondary | ICD-10-CM | POA: Diagnosis not present

## 2023-12-26 DIAGNOSIS — M0579 Rheumatoid arthritis with rheumatoid factor of multiple sites without organ or systems involvement: Secondary | ICD-10-CM | POA: Diagnosis not present

## 2023-12-26 DIAGNOSIS — Z79899 Other long term (current) drug therapy: Secondary | ICD-10-CM | POA: Diagnosis not present

## 2023-12-29 DIAGNOSIS — M0579 Rheumatoid arthritis with rheumatoid factor of multiple sites without organ or systems involvement: Secondary | ICD-10-CM | POA: Diagnosis not present

## 2024-04-10 ENCOUNTER — Telehealth: Payer: Self-pay

## 2024-04-10 DIAGNOSIS — Z0000 Encounter for general adult medical examination without abnormal findings: Secondary | ICD-10-CM

## 2024-04-10 DIAGNOSIS — R7309 Other abnormal glucose: Secondary | ICD-10-CM

## 2024-04-10 DIAGNOSIS — M0579 Rheumatoid arthritis with rheumatoid factor of multiple sites without organ or systems involvement: Secondary | ICD-10-CM

## 2024-04-10 DIAGNOSIS — E7801 Familial hypercholesterolemia: Secondary | ICD-10-CM

## 2024-04-10 NOTE — Telephone Encounter (Signed)
 Copied from CRM [id number]. Topic: Clinical - Request for Lab/Test Order >> Apr 10, 2024  3:12 PM Zipporah Him wrote: Reason for CRM: Patient scheduled a physical with Dr. Linnell Richardson for May 13th and would like to know if he still prefers her to come get labs ahead of the physical. Please call to advise and place labs if needed.

## 2024-04-10 NOTE — Telephone Encounter (Signed)
 I do prefer her to do labs before her physical. I have placed future orders for labs. Please contact her to schedule lab only sometime before 5/13  Thank you  Domingo Friend, DO [facility] 04/10/2024, 3:19 PM

## 2024-04-10 NOTE — Addendum Note (Signed)
 Addended by: Raina Bunting on: 04/10/2024 03:18 PM   Modules accepted: Orders

## 2024-04-10 NOTE — Telephone Encounter (Signed)
Lab appointment scheduled for patient.

## 2024-04-16 ENCOUNTER — Other Ambulatory Visit

## 2024-04-16 DIAGNOSIS — M0579 Rheumatoid arthritis with rheumatoid factor of multiple sites without organ or systems involvement: Secondary | ICD-10-CM

## 2024-04-16 DIAGNOSIS — Z0000 Encounter for general adult medical examination without abnormal findings: Secondary | ICD-10-CM

## 2024-04-16 DIAGNOSIS — R7309 Other abnormal glucose: Secondary | ICD-10-CM | POA: Diagnosis not present

## 2024-04-16 DIAGNOSIS — E7801 Familial hypercholesterolemia: Secondary | ICD-10-CM

## 2024-04-16 DIAGNOSIS — E78019 Familial hypercholesterolemia, unspecified: Secondary | ICD-10-CM

## 2024-04-17 LAB — CBC WITH DIFFERENTIAL/PLATELET
Absolute Lymphocytes: 2161 {cells}/uL (ref 850–3900)
Absolute Monocytes: 402 {cells}/uL (ref 200–950)
Basophils Absolute: 44 {cells}/uL (ref 0–200)
Basophils Relative: 0.6 %
Eosinophils Absolute: 139 {cells}/uL (ref 15–500)
Eosinophils Relative: 1.9 %
HCT: 39.1 % (ref 35.0–45.0)
Hemoglobin: 13 g/dL (ref 11.7–15.5)
MCH: 30.3 pg (ref 27.0–33.0)
MCHC: 33.2 g/dL (ref 32.0–36.0)
MCV: 91.1 fL (ref 80.0–100.0)
MPV: 9 fL (ref 7.5–12.5)
Monocytes Relative: 5.5 %
Neutro Abs: 4555 {cells}/uL (ref 1500–7800)
Neutrophils Relative %: 62.4 %
Platelets: 344 10*3/uL (ref 140–400)
RBC: 4.29 10*6/uL (ref 3.80–5.10)
RDW: 13.9 % (ref 11.0–15.0)
Total Lymphocyte: 29.6 %
WBC: 7.3 10*3/uL (ref 3.8–10.8)

## 2024-04-17 LAB — COMPREHENSIVE METABOLIC PANEL WITH GFR
AG Ratio: 2 (calc) (ref 1.0–2.5)
ALT: 19 U/L (ref 6–29)
AST: 23 U/L (ref 10–35)
Albumin: 5 g/dL (ref 3.6–5.1)
Alkaline phosphatase (APISO): 77 U/L (ref 37–153)
BUN: 14 mg/dL (ref 7–25)
CO2: 33 mmol/L — ABNORMAL HIGH (ref 20–32)
Calcium: 10.8 mg/dL — ABNORMAL HIGH (ref 8.6–10.4)
Chloride: 101 mmol/L (ref 98–110)
Creat: 0.68 mg/dL (ref 0.60–1.00)
Globulin: 2.5 g/dL (ref 1.9–3.7)
Glucose, Bld: 82 mg/dL (ref 65–99)
Potassium: 4.1 mmol/L (ref 3.5–5.3)
Sodium: 141 mmol/L (ref 135–146)
Total Bilirubin: 0.6 mg/dL (ref 0.2–1.2)
Total Protein: 7.5 g/dL (ref 6.1–8.1)
eGFR: 91 mL/min/{1.73_m2} (ref >=60)

## 2024-04-17 LAB — LIPID PANEL
Cholesterol: 306 mg/dL — ABNORMAL HIGH (ref <200)
HDL: 70 mg/dL (ref >=50)
LDL Cholesterol (Calc): 200 mg/dL — ABNORMAL HIGH
Non-HDL Cholesterol (Calc): 236 mg/dL — ABNORMAL HIGH (ref <130)
Total CHOL/HDL Ratio: 4.4 (calc) (ref <5.0)
Triglycerides: 184 mg/dL — ABNORMAL HIGH (ref <150)

## 2024-04-17 LAB — HEMOGLOBIN A1C
Hgb A1c MFr Bld: 5.3 % (ref <5.7)
Mean Plasma Glucose: 105 mg/dL
eAG (mmol/L): 5.8 mmol/L

## 2024-04-17 LAB — TSH: TSH: 4.55 m[IU]/L — ABNORMAL HIGH (ref 0.40–4.50)

## 2024-04-23 ENCOUNTER — Encounter: Payer: Self-pay | Admitting: Family Medicine

## 2024-04-23 ENCOUNTER — Ambulatory Visit (INDEPENDENT_AMBULATORY_CARE_PROVIDER_SITE_OTHER): Admitting: Family Medicine

## 2024-04-23 VITALS — BP 120/60 | HR 92 | Ht 64.5 in | Wt 127.2 lb

## 2024-04-23 DIAGNOSIS — I6523 Occlusion and stenosis of bilateral carotid arteries: Secondary | ICD-10-CM | POA: Diagnosis not present

## 2024-04-23 DIAGNOSIS — E7801 Familial hypercholesterolemia: Secondary | ICD-10-CM

## 2024-04-23 DIAGNOSIS — Z0000 Encounter for general adult medical examination without abnormal findings: Secondary | ICD-10-CM | POA: Diagnosis not present

## 2024-04-23 DIAGNOSIS — Z1231 Encounter for screening mammogram for malignant neoplasm of breast: Secondary | ICD-10-CM | POA: Diagnosis not present

## 2024-04-23 DIAGNOSIS — M0579 Rheumatoid arthritis with rheumatoid factor of multiple sites without organ or systems involvement: Secondary | ICD-10-CM | POA: Diagnosis not present

## 2024-04-23 NOTE — Progress Notes (Signed)
 Subjective:    Patient ID: Amber Parker, female    DOB: 03-25-1951, 73 y.o.   MRN: [id number]  Amber Parker is a 73 y.o. female presenting on 04/23/2024 for Annual Exam   HPI  GERD History of H Pylori No recent history with GI Methotrexate is impacting her with side effects.   HYPERLIPIDEMIA No longer with Cardiology Reports chronic history since age 12s, has been on most statin medications with intolerance due to myalgias - Last lipids 2025 show elevated lipids LDL 200 - Not on cholesterol med currently   Follow-up Rheumatoid Arthritis Rheumatoid Arthritis Followed by Rheumatology [facility]. On Methotrexate 8 years now Apt tomorrow  Elevated TSH Last lab showed TSH 4.55, no prior abnormal readings Asymptomatic   Health Maintenance:  Previous Prevnar-13 2018. Due for Prevnar-20 and she will reconsider.  Last Colonoscopy done 2018 - done by Dr Felicita [facility], next due in 10 years, 2028.  DEXA 08/09/22 last done. Per Rheumatologist.      04/23/2024    3:16 PM 12/14/2021    1:27 PM 09/21/2021    2:02 PM  Depression screen PHQ 2/9  Decreased Interest 0 0 0  Down, Depressed, Hopeless 0 0 0  PHQ - 2 Score 0 0 0  Altered sleeping 0 0 0  Tired, decreased energy 1 0 1  Change in appetite 0 0 0  Feeling bad or failure about yourself  0 0 0  Trouble concentrating 0 0 0  Moving slowly or fidgety/restless 0 0 0  Suicidal thoughts 0 0 0  PHQ-9 Score 1 0 1  Difficult doing work/chores Not difficult at all Not difficult at all Not difficult at all       04/23/2024    3:16 PM 12/14/2021    1:27 PM 09/21/2021    2:02 PM  GAD 7 : Generalized Anxiety Score  Nervous, Anxious, on Edge 0 0 0  Control/stop worrying 0 0 0  Worry too much - different things 0 0 0  Trouble relaxing 0 0 0  Restless 0 0 0  Easily annoyed or irritable 0 0 0  Afraid - awful might happen 0 0 0  Total GAD 7 Score 0 0 0  Anxiety Difficulty  Not difficult at all Not difficult at all     Past Medical  History:  Diagnosis Date   Carotid artery stenosis    BILATERAL   Hyperlipidemia    Past Surgical History:  Procedure Laterality Date   CHOLECYSTECTOMY     COLONOSCOPY WITH PROPOFOL  N/A 12/26/2016   Procedure: COLONOSCOPY WITH PROPOFOL ;  Surgeon: Cassie Click, MD;  Location: [facility] ENDOSCOPY;  Service: Endoscopy;  Laterality: N/A;   OOPHRECTOMY Bilateral    PARTIAL REMOVAL OF BOTH OVARIES   TONSILLECTOMY     Social History   Socioeconomic History   Marital status: Widowed    Spouse name: Not on file   Number of children: Not on file   Years of education: Not on file   Highest education level: High school graduate  Occupational History   Occupation: retired  Tobacco Use   Smoking status: Never   Smokeless tobacco: Never  Vaping Use   Vaping status: Never Used  Substance and Sexual Activity   Alcohol use: No   Drug use: No   Sexual activity: Not on file  Other Topics Concern   Not on file  Social History Narrative   Takes care of grandchildren during week    Social Drivers of  Health   Financial Resource Strain: Low Risk  (07/17/2018)   Overall Financial Resource Strain (CARDIA)    Difficulty of Paying Living Expenses: Not hard at all  Food Insecurity: No Food Insecurity (07/17/2018)   Hunger Vital Sign    Worried About Running Out of Food in the Last Year: Never true    Ran Out of Food in the Last Year: Never true  Transportation Needs: No Transportation Needs (07/17/2018)   PRAPARE - Administrator, Civil Service (Medical): No    Lack of Transportation (Non-Medical): No  Physical Activity: Inactive (07/17/2018)   Exercise Vital Sign    Days of Exercise per Week: 0 days    Minutes of Exercise per Session: 0 min  Stress: No Stress Concern Present (07/17/2018)   [employer] of Occupational Health - Occupational Stress Questionnaire    Feeling of Stress : Not at all  Social Connections: Moderately Integrated (07/17/2018)   Social Connection and Isolation  Panel [NHANES]    Frequency of Communication with Friends and Family: More than three times a week    Frequency of Social Gatherings with Friends and Family: More than three times a week    Attends Religious Services: More than 4 times per year    Active Member of [employer] or Organizations: No    Attends Banker Meetings: Never    Marital Status: Married  Catering manager Violence: Not At Risk (07/17/2018)   Humiliation, Afraid, Rape, and Kick questionnaire    Fear of Current or Ex-Partner: No    Emotionally Abused: No    Physically Abused: No    Sexually Abused: No   Family History  Problem Relation Age of Onset   Breast cancer Paternal Aunt 56   Hypertension Mother    Hyperlipidemia Mother    Stroke Mother    Heart disease Mother    Cancer Mother        lung cancer   Diabetes Mother    COPD Mother    Heart attack Mother    Neurologic Disorder Father    Cancer Father        colon cancer   Current Outpatient Medications on File Prior to Visit  Medication Sig   aspirin EC 81 MG tablet Take 81 mg by mouth every Monday, Wednesday, and Friday. Swallow whole.   diclofenac  sodium (VOLTAREN ) 1 % GEL APPLY 2 GRAMS 4 TIMES A DAY AS NEEDED, USE ON HANDS FOR ARTHRITIS UP TO 1-2 WEEKS PER FLARE   folic acid (FOLVITE) 1 MG tablet Take 1 tablet by mouth daily.   methotrexate (RHEUMATREX) 2.5 MG tablet TAKE 6 TABLETS BY MOUTH SAME DAY WEEKLY WITH FOOD   methotrexate (RHEUMATREX) 2.5 MG tablet Take 12.5 mg by mouth once a week.   naproxen sodium (ALEVE) 220 MG tablet Take 220 mg by mouth.   sodium chloride  (OCEAN) 0.65 % SOLN nasal spray Place 1 spray into both nostrils as needed for congestion.   No current facility-administered medications on file prior to visit.    Review of Systems  Constitutional:  Negative for activity change, appetite change, chills, diaphoresis, fatigue and fever.  HENT:  Negative for congestion and hearing loss.   Eyes:  Negative for visual disturbance.   Respiratory:  Negative for cough, chest tightness, shortness of breath and wheezing.   Cardiovascular:  Negative for chest pain, palpitations and leg swelling.  Gastrointestinal:  Negative for abdominal pain, constipation, diarrhea, nausea and vomiting.  Genitourinary:  Negative for dysuria,  frequency and hematuria.  Musculoskeletal:  Negative for arthralgias and neck pain.  Skin:  Negative for rash.  Neurological:  Negative for dizziness, weakness, light-headedness, numbness and headaches.  Hematological:  Negative for adenopathy.  Psychiatric/Behavioral:  Negative for behavioral problems, dysphoric mood and sleep disturbance.    Per HPI unless specifically indicated above     Objective:     BP 120/60 (BP Location: Right Arm, Patient Position: Sitting, Cuff Size: Normal)   Pulse 92   Ht 5' 4.5" (1.638 m)   Wt 127 lb 4 oz (57.7 kg)   SpO2 99%   BMI 21.51 kg/m   Wt Readings from Last 3 Encounters:  04/23/24 127 lb 4 oz (57.7 kg)  01/25/23 134 lb 12.8 oz (61.1 kg)  12/14/21 140 lb 3.2 oz (63.6 kg)    Physical Exam Vitals and nursing note reviewed.  Constitutional:      General: She is not in acute distress.    Appearance: She is well-developed. She is not diaphoretic.     Comments: Well-appearing, comfortable, cooperative  HENT:     Head: Normocephalic and atraumatic.  Eyes:     General:        Right eye: No discharge.        Left eye: No discharge.     Conjunctiva/sclera: Conjunctivae normal.     Pupils: Pupils are equal, round, and reactive to light.  Neck:     Thyroid : No thyromegaly.     Vascular: No carotid bruit.  Cardiovascular:     Rate and Rhythm: Normal rate and regular rhythm.     Pulses: Normal pulses.     Heart sounds: Normal heart sounds. No murmur heard. Pulmonary:     Effort: Pulmonary effort is normal. No respiratory distress.     Breath sounds: Normal breath sounds. No wheezing or rales.  Abdominal:     General: Bowel sounds are normal. There is  no distension.     Palpations: Abdomen is soft. There is no mass.     Tenderness: There is no abdominal tenderness.  Musculoskeletal:        General: No tenderness. Normal range of motion.     Cervical back: Normal range of motion and neck supple.     Right lower leg: No edema.     Left lower leg: No edema.     Comments: Upper / Lower Extremities: - Normal muscle tone, strength bilateral upper extremities 5/5, lower extremities 5/5  Lymphadenopathy:     Cervical: No cervical adenopathy.  Skin:    General: Skin is warm and dry.     Findings: No erythema or rash.  Neurological:     Mental Status: She is alert and oriented to person, place, and time.     Comments: Distal sensation intact to light touch all extremities  Psychiatric:        Mood and Affect: Mood normal.        Behavior: Behavior normal.        Thought Content: Thought content normal.     Comments: Well groomed, good eye contact, normal speech and thoughts     DXA bone density  Anatomical Region Laterality Modality  -- -- Radiographic Imaging   Narrative  Table formatting from the original result was not included. [facility]- DEXA Report Amber Parker  REFERRING:  DEFOOR  TECHNOLOGIST:    Casey Clay HISTORY:  BASELINE BONE DENSITY. 73 YEAR OLD WHITE FEMALE.   SITE DATE BMD g/cm  T-SCORE Z-SCORE g/cm CHANGE % CHANGE  STATISTICALLY               SIGNIFICANT? LUMBAR SPINE L1- L4 08-09-22 0.852 -1.8               HIP L.FEM. NECK 08-09-22 0.513 -3.0                L.TOTAL HIP 08-09-22 0.724 -1.8               FOREARM L/R 33% 08-09-22 0.548 -2.4                INTERPRETATION:  Osteoporosis   FRACTURE RISK ASSESSMENT (FRAX) __9.8___  10 year risk of hip fracture.      __25___  10 year risk of any major fracture.  TREATMENT:  The National Osteoporosis Foundation (2014) Treatment Guidelines for: Consider FDA-approved medical therapies in  postmenopausal women and men aged 2 years and older, based on the following: A hip or vertebral (clinical or morphometric) fracture T-score = -2.5 at the femoral neck or spine after appropriate evaluation to exclude secondary causes Low bone mass (T-score between -1.0 and -2.5 at the femoral neck or spine) and a 10-year probability of a hip fracture = 3% or a 10-year probability of a major osteoporosis-related fracture = 20% based on the UA-adapted WHO algorithm Clinicians judgment and/or patient preferences may indicate treatment for people with 10-year fracture probabilities above or below these levels BMD should be monitored two years after initiating therapy and at two-year intervals thereafter.  _______________________________________ Tara Fanti, M.D.          [facility] is a [facility] [employer] facility and technologist Least Significant Change [facility]): Technologist  Site LS Spine   Site Hip   Site Forearm TW    0.024 g/cm   0.030 g/cm  0.021 g/cm at 95% confidence level Exam End: 08/09/22 13:05 Last Resulted: 08/09/22 20:26  Received From: [facility]  Result Received: 04/10/24 15:06    Results for orders placed or performed in visit on 04/16/24  Comprehensive metabolic panel with GFR   Collection Time: 04/16/24  9:00 AM  Result Value Ref Range   Glucose, Bld 82 65 - 99 mg/dL   BUN 14 7 - 25 mg/dL   Creat 4.09 8.11 - 9.14 mg/dL   eGFR 91 > OR = 60 NW/GNF/6.21H0   BUN/Creatinine Ratio SEE NOTE: 6 - 22 (calc)   Sodium 141 135 - 146 mmol/L   Potassium 4.1 3.5 - 5.3 mmol/L   Chloride 101 98 - 110 mmol/L   CO2 33 (H) 20 - 32 mmol/L   Calcium 10.8 (H) 8.6 - 10.4 mg/dL   Total Protein 7.5 6.1 - 8.1 g/dL   Albumin 5.0 3.6 - 5.1 g/dL   Globulin 2.5 1.9 - 3.7 g/dL (calc)   AG Ratio 2.0 1.0 - 2.5 (calc)   Total Bilirubin 0.6 0.2 - 1.2 mg/dL   Alkaline phosphatase (APISO) 77 37 - 153 U/L   AST 23 10 - 35 U/L   ALT 19 6 -  29 U/L  CBC with Differential/Platelet   Collection Time: 04/16/24  9:00 AM  Result Value Ref Range   WBC 7.3 3.8 - 10.8 Thousand/uL   RBC 4.29 3.80 - 5.10 Million/uL   Hemoglobin 13.0 11.7 - 15.5 g/dL  HCT 39.1 35.0 - 45.0 %   MCV 91.1 80.0 - 100.0 fL   MCH 30.3 27.0 - 33.0 pg   MCHC 33.2 32.0 - 36.0 g/dL   RDW 16.1 09.6 - 04.5 %   Platelets 344 140 - 400 Thousand/uL   MPV 9.0 7.5 - 12.5 fL   Neutro Abs 4,555 1,500 - 7,800 cells/uL   Absolute Lymphocytes 2,161 850 - 3,900 cells/uL   Absolute Monocytes 402 200 - 950 cells/uL   Eosinophils Absolute 139 15 - 500 cells/uL   Basophils Absolute 44 0 - 200 cells/uL   Neutrophils Relative % 62.4 %   Total Lymphocyte 29.6 %   Monocytes Relative 5.5 %   Eosinophils Relative 1.9 %   Basophils Relative 0.6 %  Hemoglobin A1c   Collection Time: 04/16/24  9:00 AM  Result Value Ref Range   Hgb A1c MFr Bld 5.3 <5.7 %   Mean Plasma Glucose 105 mg/dL   eAG (mmol/L) 5.8 mmol/L  Lipid panel   Collection Time: 04/16/24  9:00 AM  Result Value Ref Range   Cholesterol 306 (H) <200 mg/dL   HDL 70 > OR = 50 mg/dL   Triglycerides 409 (H) <150 mg/dL   LDL Cholesterol (Calc) 200 (H) mg/dL (calc)   Total CHOL/HDL Ratio 4.4 <5.0 (calc)   Non-HDL Cholesterol (Calc) 236 (H) <130 mg/dL (calc)  TSH   Collection Time: 04/16/24  9:00 AM  Result Value Ref Range   TSH 4.55 (H) 0.40 - 4.50 mIU/L      Assessment & Plan:   Problem List Items Addressed This Visit     Bilateral carotid artery stenosis   Relevant Medications   aspirin EC 81 MG tablet   Familial hypercholesteremia   Relevant Medications   aspirin EC 81 MG tablet   Rheumatoid arthritis (HCC)   Relevant Medications   aspirin EC 81 MG tablet   methotrexate (RHEUMATREX) 2.5 MG tablet   Other Visit Diagnoses       Annual physical exam    -  Primary     Encounter for screening mammogram for malignant neoplasm of breast       Relevant Orders   MM 3D SCREENING MAMMOGRAM BILATERAL BREAST         Updated Health Maintenance information Reviewed recent lab results with patient Encouraged improvement to lifestyle with diet and exercise Goal of weight loss  RA Followed by Rheumatology On medication management Methotrexate, she thinks some GI side effect. She will discuss with Rheumatology.   Familial Hyperlipidemia LDL calc 200, prior history 199-230s  The 10-year ASCVD risk score (Arnett DK, et al., 2019) is: 14.9%   Values used to calculate the score:     Age: 50 years     Sex: Female     Is Non-Hispanic African American: No     Diabetic: No     Tobacco smoker: No     Systolic Blood Pressure: 120 mmHg     Is BP treated: No     HDL Cholesterol: 70 mg/dL     Total Cholesterol: 306 mg/dL  Failed multiple statin treatments. See HPI Again Recommend PCSK9 inhibitor. She is hesitant to start this medication  Offer Coronary Calcium Score CT screening to help better diagnose if there is ASCVD disease present and if she should pursue therapy. She is considering this option.   TSH mild elevated, not significant repeat next year + T4   Orders Placed This Encounter  Procedures   MM 3D SCREENING  MAMMOGRAM BILATERAL BREAST    Standing Status:   Future    Expiration Date:   04/23/2025    Reason for Exam (SYMPTOM  OR DIAGNOSIS REQUIRED):   Screening bilateral 3D Mammogram Tomo    Preferred imaging location?:   [facility]    No orders of the defined types were placed in this encounter.    Follow up plan: Return for 1 year fasting lab > 1 week later Annual Physical.  Domingo Friend, DO [facility] 04/23/2024, 3:30 PM

## 2024-04-23 NOTE — Patient Instructions (Addendum)
 Thank you for coming to the office today.  For Mammogram screening for breast cancer   Call the Imaging Center below anytime to schedule your own appointment now that order has been placed.  [facility] at [facility] [address], [address] [place], [place] [address] Phone: [phone]  -------------------------------  Familial Hyperlipidemia  Elevated LDL cholesterol 200 on last lab. I do recommend the Repatha injection for cholesterol  Coronary Calcium Score Cardiac CT Scan. This is a screening test for patients aged 72-50+ with cardiovascular risk factors or who are healthy but would be interested in Cardiovascular Screening for heart disease. Even if there is a family history of heart disease, this imaging can be useful. Typically it can be done every 5+ years or at a different timeline we agree on  The scan will look at the chest and mainly focus on the heart and identify early signs of calcium build up or blockages within the heart arteries. It is not 100% accurate for identifying blockages or heart disease, but it is useful to help us  predict who may have some early changes or be at risk in the future for a heart attack or cardiovascular problem.  The results are reviewed by a Cardiologist and they will document the results. It should become available on MyChart. Typically the results are divided into percentiles based on other patients of the same demographic and age. So it will compare your risk to others similar to you. If you have a higher score >99 or higher percentile >75%tile, it is recommended to consider Statin cholesterol therapy and or referral to Cardiologist. I will try to help explain your results and if we have questions we can contact the Cardiologist.  You will be contacted for scheduling. Usually it is done at any imaging facility through [facility], [facility] or [facility].  The cost is $99 flat fee total and it does not go through insurance, so no authorization is required.  DUE for FASTING BLOOD WORK (no food or drink after midnight before the lab appointment, only water or coffee without cream/sugar on the morning of)  SCHEDULE "Lab Only" visit in the morning at the clinic for lab draw in 1 YEAR  - Make sure Lab Only appointment is at about 1 week before your next appointment, so that results will be available  For Lab Results, once available within 2-3 days of blood draw, you can can log in to MyChart online to view your results and a brief explanation. Also, we can discuss results at next follow-up visit.    Please schedule a Follow-up Appointment to: Return for 1 year fasting lab > 1 week later Annual Physical.  If you have any other questions or concerns, please feel free to call the office or send a message through MyChart. You may also schedule an earlier appointment if necessary.  Additionally, you may be receiving a survey about your experience at our office within a few days to 1 week by e-mail or mail. We value your feedback.  Domingo Friend, DO [facility] [place]

## 2024-04-24 DIAGNOSIS — M0579 Rheumatoid arthritis with rheumatoid factor of multiple sites without organ or systems involvement: Secondary | ICD-10-CM | POA: Diagnosis not present

## 2024-04-24 DIAGNOSIS — Z79899 Other long term (current) drug therapy: Secondary | ICD-10-CM | POA: Diagnosis not present

## 2024-04-26 DIAGNOSIS — H18513 Endothelial corneal dystrophy, bilateral: Secondary | ICD-10-CM | POA: Diagnosis not present

## 2024-04-26 DIAGNOSIS — H2513 Age-related nuclear cataract, bilateral: Secondary | ICD-10-CM | POA: Diagnosis not present

## 2024-04-26 DIAGNOSIS — H353131 Nonexudative age-related macular degeneration, bilateral, early dry stage: Secondary | ICD-10-CM | POA: Diagnosis not present

## 2024-04-30 ENCOUNTER — Encounter

## 2024-05-07 ENCOUNTER — Ambulatory Visit
Admission: RE | Admit: 2024-05-07 | Discharge: 2024-05-07 | Disposition: A | Discharge status: Home / Self Care | Source: Ambulatory Visit | Attending: Family Medicine | Admitting: Family Medicine

## 2024-05-07 DIAGNOSIS — Z1231 Encounter for screening mammogram for malignant neoplasm of breast: Secondary | ICD-10-CM | POA: Diagnosis not present

## 2024-06-18 DIAGNOSIS — M0579 Rheumatoid arthritis with rheumatoid factor of multiple sites without organ or systems involvement: Secondary | ICD-10-CM | POA: Diagnosis not present

## 2024-06-18 DIAGNOSIS — Z79899 Other long term (current) drug therapy: Secondary | ICD-10-CM | POA: Diagnosis not present

## 2024-07-22 DIAGNOSIS — H6123 Impacted cerumen, bilateral: Secondary | ICD-10-CM | POA: Diagnosis not present

## 2024-07-22 DIAGNOSIS — H9201 Otalgia, right ear: Secondary | ICD-10-CM | POA: Diagnosis not present

## 2024-07-22 DIAGNOSIS — R070 Pain in throat: Secondary | ICD-10-CM | POA: Diagnosis not present

## 2024-07-22 DIAGNOSIS — K219 Gastro-esophageal reflux disease without esophagitis: Secondary | ICD-10-CM | POA: Diagnosis not present

## 2024-07-29 DIAGNOSIS — M0570 Rheumatoid arthritis with rheumatoid factor of unspecified site without organ or systems involvement: Secondary | ICD-10-CM | POA: Diagnosis not present
